# Patient Record
Sex: Male | Born: 1955
Health system: Southern US, Community
[De-identification: ages and names within clinical notes are randomized; demographics above are authoritative.]

## PROBLEM LIST (undated history)

## (undated) DIAGNOSIS — E7800 Pure hypercholesterolemia, unspecified: Secondary | ICD-10-CM

## (undated) DIAGNOSIS — M1990 Unspecified osteoarthritis, unspecified site: Secondary | ICD-10-CM

## (undated) DIAGNOSIS — E119 Type 2 diabetes mellitus without complications: Secondary | ICD-10-CM

## (undated) DIAGNOSIS — K219 Gastro-esophageal reflux disease without esophagitis: Secondary | ICD-10-CM

## (undated) HISTORY — PX: CLEFT PALATE REPAIR: SUR1165

## (undated) HISTORY — PX: CLEFT LIP REPAIR: SUR1164

## (undated) HISTORY — DX: Type 2 diabetes mellitus without complications: E11.9

---

## 2013-08-27 ENCOUNTER — Other Ambulatory Visit: Payer: Self-pay | Admitting: Physical Medicine and Rehabilitation

## 2013-08-27 DIAGNOSIS — M25511 Pain in right shoulder: Secondary | ICD-10-CM

## 2013-08-31 ENCOUNTER — Ambulatory Visit
Admission: RE | Admit: 2013-08-31 | Discharge: 2013-08-31 | Disposition: A | Discharge status: Home / Self Care | Payer: 59 | Source: Ambulatory Visit | Attending: Physical Medicine and Rehabilitation | Admitting: Physical Medicine and Rehabilitation

## 2013-08-31 DIAGNOSIS — M25511 Pain in right shoulder: Secondary | ICD-10-CM

## 2013-09-03 ENCOUNTER — Ambulatory Visit: Payer: 59 | Attending: Physical Medicine and Rehabilitation

## 2013-09-03 DIAGNOSIS — M25619 Stiffness of unspecified shoulder, not elsewhere classified: Secondary | ICD-10-CM | POA: Insufficient documentation

## 2013-09-03 DIAGNOSIS — R5381 Other malaise: Secondary | ICD-10-CM | POA: Insufficient documentation

## 2013-09-03 DIAGNOSIS — M25519 Pain in unspecified shoulder: Secondary | ICD-10-CM | POA: Insufficient documentation

## 2013-09-03 DIAGNOSIS — IMO0001 Reserved for inherently not codable concepts without codable children: Secondary | ICD-10-CM | POA: Insufficient documentation

## 2013-09-11 ENCOUNTER — Ambulatory Visit: Payer: 59 | Admitting: Physical Therapy

## 2013-09-16 ENCOUNTER — Ambulatory Visit: Payer: 59

## 2013-09-19 ENCOUNTER — Ambulatory Visit: Payer: 59 | Admitting: Physical Therapy

## 2013-09-23 ENCOUNTER — Ambulatory Visit: Payer: 59

## 2013-09-25 ENCOUNTER — Ambulatory Visit: Payer: 59

## 2013-09-30 ENCOUNTER — Ambulatory Visit: Payer: 59

## 2013-10-02 ENCOUNTER — Ambulatory Visit: Payer: 59 | Attending: Physical Medicine and Rehabilitation

## 2013-10-02 DIAGNOSIS — IMO0001 Reserved for inherently not codable concepts without codable children: Secondary | ICD-10-CM | POA: Insufficient documentation

## 2013-10-02 DIAGNOSIS — M25619 Stiffness of unspecified shoulder, not elsewhere classified: Secondary | ICD-10-CM | POA: Insufficient documentation

## 2013-10-02 DIAGNOSIS — R5381 Other malaise: Secondary | ICD-10-CM | POA: Insufficient documentation

## 2013-10-02 DIAGNOSIS — M25519 Pain in unspecified shoulder: Secondary | ICD-10-CM | POA: Insufficient documentation

## 2013-10-07 ENCOUNTER — Ambulatory Visit: Payer: 59

## 2013-10-09 ENCOUNTER — Ambulatory Visit: Payer: 59

## 2013-10-14 ENCOUNTER — Ambulatory Visit: Payer: 59

## 2014-08-08 ENCOUNTER — Emergency Department (HOSPITAL_COMMUNITY)
Admission: EM | Admit: 2014-08-08 | Discharge: 2014-08-08 | Disposition: A | Discharge status: Home / Self Care | Payer: 59 | Attending: Emergency Medicine | Admitting: Emergency Medicine

## 2014-08-08 ENCOUNTER — Emergency Department (HOSPITAL_COMMUNITY): Payer: 59

## 2014-08-08 ENCOUNTER — Encounter (HOSPITAL_COMMUNITY): Payer: Self-pay | Admitting: *Deleted

## 2014-08-08 DIAGNOSIS — Y929 Unspecified place or not applicable: Secondary | ICD-10-CM | POA: Insufficient documentation

## 2014-08-08 DIAGNOSIS — Z7982 Long term (current) use of aspirin: Secondary | ICD-10-CM | POA: Diagnosis not present

## 2014-08-08 DIAGNOSIS — W11XXXA Fall on and from ladder, initial encounter: Secondary | ICD-10-CM | POA: Insufficient documentation

## 2014-08-08 DIAGNOSIS — R42 Dizziness and giddiness: Secondary | ICD-10-CM | POA: Insufficient documentation

## 2014-08-08 DIAGNOSIS — W19XXXA Unspecified fall, initial encounter: Secondary | ICD-10-CM

## 2014-08-08 DIAGNOSIS — Y939 Activity, unspecified: Secondary | ICD-10-CM | POA: Diagnosis not present

## 2014-08-08 DIAGNOSIS — M7981 Nontraumatic hematoma of soft tissue: Secondary | ICD-10-CM | POA: Insufficient documentation

## 2014-08-08 DIAGNOSIS — Y999 Unspecified external cause status: Secondary | ICD-10-CM | POA: Insufficient documentation

## 2014-08-08 DIAGNOSIS — Z79899 Other long term (current) drug therapy: Secondary | ICD-10-CM | POA: Diagnosis not present

## 2014-08-08 DIAGNOSIS — E780 Pure hypercholesterolemia: Secondary | ICD-10-CM | POA: Diagnosis not present

## 2014-08-08 DIAGNOSIS — K219 Gastro-esophageal reflux disease without esophagitis: Secondary | ICD-10-CM | POA: Insufficient documentation

## 2014-08-08 DIAGNOSIS — S3991XA Unspecified injury of abdomen, initial encounter: Secondary | ICD-10-CM | POA: Insufficient documentation

## 2014-08-08 HISTORY — DX: Pure hypercholesterolemia, unspecified: E78.00

## 2014-08-08 HISTORY — DX: Gastro-esophageal reflux disease without esophagitis: K21.9

## 2014-08-08 LAB — I-STAT CHEM 8, ED
BUN: 19 mg/dL (ref 6–20)
Calcium, Ion: 1.11 mmol/L — ABNORMAL LOW (ref 1.13–1.30)
Chloride: 100 mmol/L — ABNORMAL LOW (ref 101–111)
Creatinine, Ser: 0.9 mg/dL (ref 0.61–1.24)
Glucose, Bld: 108 mg/dL — ABNORMAL HIGH (ref 70–99)
HCT: 42 % (ref 39.0–52.0)
Hemoglobin: 14.3 g/dL (ref 13.0–17.0)
Potassium: 4.1 mmol/L (ref 3.5–5.1)
Sodium: 137 mmol/L (ref 135–145)
TCO2: 24 mmol/L (ref 0–100)

## 2014-08-08 MED ORDER — IOHEXOL 300 MG/ML  SOLN
100.0000 mL | Freq: Once | INTRAMUSCULAR | Status: AC | PRN
  Administered 2014-08-08: 100 mL via INTRAVENOUS

## 2014-08-08 MED ORDER — FENTANYL CITRATE (PF) 100 MCG/2ML IJ SOLN
50.0000 ug | INTRAMUSCULAR | Status: DC | PRN
  Administered 2014-08-08: 50 ug via INTRAVENOUS
  Filled 2014-08-08: qty 2

## 2014-08-08 NOTE — ED Notes (Signed)
Pt took 2 extra strength tylenol at home for the pain

## 2014-08-08 NOTE — ED Provider Notes (Signed)
CSN: [id number]     Arrival date & time 08/08/14  1740 History   First MD Initiated Contact with Patient 08/08/14 1913     Chief Complaint  Patient presents with  . Fall     (Consider location/radiation/quality/duration/timing/severity/associated sxs/prior Treatment) HPI Comments: 59 year old male with high cholesterol, reflux presents with right flank pain and lightheadedness since falling from 6 feet on latter prior to arrival. No head injury no syncope. No blood thinners except for baby aspirin. Patient has tenderness and swelling to palpation right posterior flank. Pain improved with not moving  Patient is a 59 y.o. male presenting with fall. The history is provided by the patient.  Fall Pertinent negatives include no chest pain, no abdominal pain, no headaches and no shortness of breath.    Past Medical History  Diagnosis Date  . High cholesterol   . GERD (gastroesophageal reflux disease)    History reviewed. No pertinent past surgical history. History reviewed. No pertinent family history. History  Substance Use Topics  . Smoking status: Not on file  . Smokeless tobacco: Not on file  . Alcohol Use: No    Review of Systems  Constitutional: Negative for fever and chills.  HENT: Negative for congestion.   Eyes: Negative for visual disturbance.  Respiratory: Negative for shortness of breath.   Cardiovascular: Negative for chest pain.  Gastrointestinal: Negative for vomiting and abdominal pain.  Genitourinary: Negative for dysuria and flank pain.  Musculoskeletal: Negative for back pain, neck pain and neck stiffness.  Skin: Positive for wound. Negative for rash.  Neurological: Negative for weakness, light-headedness, numbness and headaches.      Allergies  Review of patient's allergies indicates no known allergies.  Home Medications   Prior to Admission medications   Medication Sig Start Date End Date Taking? Authorizing Provider  acetaminophen (TYLENOL) 500 MG  tablet Take 1,000 mg by mouth every 6 (six) hours as needed for moderate pain.   Yes Historical Provider, MD  aspirin EC 81 MG tablet Take 81 mg by mouth at bedtime.   Yes Historical Provider, MD  pantoprazole (PROTONIX) 40 MG tablet Take 40 mg by mouth at bedtime.   Yes Historical Provider, MD  rosuvastatin (CRESTOR) 20 MG tablet Take 20 mg by mouth at bedtime.   Yes Historical Provider, MD   BP 116/61 mmHg  Pulse 56  Temp(Src) 97.9 F (36.6 C) (Oral)  Resp 18  SpO2 95% Physical Exam  Constitutional: He is oriented to person, place, and time. He appears well-developed and well-nourished.  HENT:  Head: Normocephalic and atraumatic.  Eyes: Conjunctivae are normal. Right eye exhibits no discharge. Left eye exhibits no discharge.  Neck: Normal range of motion. Neck supple. No tracheal deviation present.  Cardiovascular: Normal rate.   Pulmonary/Chest: Effort normal and breath sounds normal.  Abdominal: Soft. He exhibits no distension. There is no tenderness. There is no guarding.  Musculoskeletal: He exhibits edema and tenderness.  Patient has mild tenderness and hematoma right posterior lower flank, no significant midline tenderness in lumbar spine, neck supple. Full range of motion of hips and knees without discomfort or effusion.  Neurological: He is alert and oriented to person, place, and time.  Reflex Scores:      Patellar reflexes are 1+ on the right side and 1+ on the left side.      Achilles reflexes are 1+ on the right side and 1+ on the left side. Patient has 5+ strength with flexion extension of hips knees and great toes, sensation  intact bilateral lower extremities  Skin: Skin is warm. No rash noted.  Psychiatric: He has a normal mood and affect.  Nursing note and vitals reviewed.   ED Course  Procedures (including critical care time) Labs Review Labs Reviewed  I-STAT CHEM 8, ED - Abnormal; Notable for the following:    Chloride 100 (*)    Glucose, Bld 108 (*)     Calcium, Ion 1.11 (*)    All other components within normal limits    Imaging Review Ct Abdomen Pelvis W Contrast  08/08/2014   CLINICAL DATA:  Fall 4-5 feet off of a ladder, landing on the back. Back pain and right flank pain. Initial encounter.  EXAM: CT ABDOMEN AND PELVIS WITH CONTRAST  TECHNIQUE: Multidetector CT imaging of the abdomen and pelvis was performed using the standard protocol following bolus administration of intravenous contrast.  CONTRAST:  164mL OMNIPAQUE IOHEXOL 300 MG/ML  SOLN  COMPARISON:  None.  FINDINGS: The visualized lung bases are clear.  There are multiple small hypoattenuating lesions in both lobes of the liver which measure up to 1.2 cm, most likely cysts or biliary hamartomas although full characterization is limited by their small size. The gallbladder, spleen, adrenal glands, kidneys, and pancreas are unremarkable.  There is a small sliding hiatal hernia. The bowel is nondilated without evidence of obstruction. Appendix is unremarkable. Bladder is unremarkable. Prostate is mildly enlarged. There is a small fat containing inguinal hernia on the right. Mild atherosclerotic calcification is noted of the abdominal aorta and its major branch vessels.  There is moderate fat stranding and hematoma in the subcutaneous tissues of the right flank. A focal hematoma posterior to the right iliac crest measures 6.3 x 2.2 cm. No acute fracture is identified. Thoracolumbar spondylosis and mild lumbar facet arthrosis are noted.  IMPRESSION: 1. Moderate soft tissue hematoma in the right flank. 2. No evidence of acute intra-abdominal or pelvic abnormality.   Electronically Signed   By: Logan Bores   On: 08/08/2014 21:55     EKG Interpretation None      MDM   Final diagnoses:  Hematoma, nontraumatic, soft tissue  Fall, initial encounter   Clinical concern for flank hematoma. With lightheadedness and worsening pain CT scan ordered for further delineation. CT scan results reviewed no  acute findings intraperitoneal. Follow-up discussed with patient.  Results and differential diagnosis were discussed with the patient/parent/guardian. Close follow up outpatient was discussed, comfortable with the plan.   Medications  fentaNYL (SUBLIMAZE) injection 50 mcg (50 mcg Intravenous Given 08/08/14 2308)  iohexol (OMNIPAQUE) 300 MG/ML solution 100 mL (100 mLs Intravenous Contrast Given 08/08/14 2126)    Filed Vitals:   08/08/14 1945 08/08/14 2015 08/08/14 2045 08/08/14 2245  BP: 119/61 108/66 116/61 123/62  Pulse: 54 48 56 61  Temp:      TempSrc:      Resp:    18  SpO2: 94% 97% 95% 97%    Final diagnoses:  Hematoma, nontraumatic, soft tissue  Fall, initial encounter   Correction Hematoma, traumatic    Elnora Morrison, MD 08/09/14 [phone]

## 2014-08-08 NOTE — Discharge Instructions (Signed)
If you were given medicines take as directed.  If you are on coumadin or contraceptives realize their levels and effectiveness is altered by many different medicines.  If you have any reaction (rash, tongues swelling, other) to the medicines stop taking and see a physician.   Please follow up as directed and return to the ER or see a physician for new or worsening symptoms.  Thank you. Filed Vitals:   08/08/14 1934 08/08/14 1945 08/08/14 2015 08/08/14 2045  BP: 116/61 119/61 108/66 116/61  Pulse: 94 54 48 56  Temp:      TempSrc:      Resp: 18     SpO2: 98% 94% 97% 95%

## 2014-08-08 NOTE — ED Notes (Signed)
Pt called out stating he felt faint/dizzy. Pt laying down in bed. Placed on BP cuff and pulse ox. VSS. Pt appears pale, no diaphoresis. Encouraged to stay in bed to prevent falls.

## 2014-08-08 NOTE — ED Notes (Signed)
Pt reports falling approx 6 ft off a ladder, no loc. Having right side back and flank pain.

## 2015-04-08 DIAGNOSIS — F4323 Adjustment disorder with mixed anxiety and depressed mood: Secondary | ICD-10-CM | POA: Diagnosis not present

## 2015-04-15 DIAGNOSIS — F4323 Adjustment disorder with mixed anxiety and depressed mood: Secondary | ICD-10-CM | POA: Diagnosis not present

## 2015-04-16 MED FILL — ROSUVASTATIN CALCIUM 40 MG: 40 | 30 days supply | Qty: 30 | Fill #2

## 2015-04-19 MED FILL — GAVILYTE-N SOLUTION: 420 | 1 days supply | Qty: 4000 | Fill #0

## 2015-04-22 DIAGNOSIS — F4323 Adjustment disorder with mixed anxiety and depressed mood: Secondary | ICD-10-CM | POA: Diagnosis not present

## 2015-04-28 DIAGNOSIS — K5730 Diverticulosis of large intestine without perforation or abscess without bleeding: Secondary | ICD-10-CM | POA: Diagnosis not present

## 2015-04-28 DIAGNOSIS — Z86010 Personal history of colonic polyps: Secondary | ICD-10-CM | POA: Diagnosis not present

## 2015-04-29 DIAGNOSIS — F4323 Adjustment disorder with mixed anxiety and depressed mood: Secondary | ICD-10-CM | POA: Diagnosis not present

## 2015-05-06 DIAGNOSIS — F4323 Adjustment disorder with mixed anxiety and depressed mood: Secondary | ICD-10-CM | POA: Diagnosis not present

## 2015-05-16 ENCOUNTER — Encounter (HOSPITAL_COMMUNITY): Payer: Self-pay | Admitting: Emergency Medicine

## 2015-05-16 ENCOUNTER — Emergency Department (HOSPITAL_COMMUNITY): Payer: 59

## 2015-05-16 ENCOUNTER — Emergency Department (HOSPITAL_COMMUNITY)
Admission: EM | Admit: 2015-05-16 | Discharge: 2015-05-16 | Disposition: A | Discharge status: Home / Self Care | Payer: 59 | Attending: Emergency Medicine | Admitting: Emergency Medicine

## 2015-05-16 DIAGNOSIS — S39012A Strain of muscle, fascia and tendon of lower back, initial encounter: Secondary | ICD-10-CM | POA: Diagnosis not present

## 2015-05-16 DIAGNOSIS — Z7984 Long term (current) use of oral hypoglycemic drugs: Secondary | ICD-10-CM | POA: Diagnosis not present

## 2015-05-16 DIAGNOSIS — Z7982 Long term (current) use of aspirin: Secondary | ICD-10-CM | POA: Insufficient documentation

## 2015-05-16 DIAGNOSIS — Y998 Other external cause status: Secondary | ICD-10-CM | POA: Diagnosis not present

## 2015-05-16 DIAGNOSIS — Y9389 Activity, other specified: Secondary | ICD-10-CM | POA: Insufficient documentation

## 2015-05-16 DIAGNOSIS — E7800 Pure hypercholesterolemia, unspecified: Secondary | ICD-10-CM | POA: Diagnosis not present

## 2015-05-16 DIAGNOSIS — X58XXXA Exposure to other specified factors, initial encounter: Secondary | ICD-10-CM | POA: Diagnosis not present

## 2015-05-16 DIAGNOSIS — Y9289 Other specified places as the place of occurrence of the external cause: Secondary | ICD-10-CM | POA: Diagnosis not present

## 2015-05-16 DIAGNOSIS — M1990 Unspecified osteoarthritis, unspecified site: Secondary | ICD-10-CM | POA: Diagnosis not present

## 2015-05-16 DIAGNOSIS — Z79899 Other long term (current) drug therapy: Secondary | ICD-10-CM | POA: Insufficient documentation

## 2015-05-16 DIAGNOSIS — S3992XA Unspecified injury of lower back, initial encounter: Secondary | ICD-10-CM | POA: Diagnosis present

## 2015-05-16 DIAGNOSIS — K219 Gastro-esophageal reflux disease without esophagitis: Secondary | ICD-10-CM | POA: Insufficient documentation

## 2015-05-16 DIAGNOSIS — M545 Low back pain: Secondary | ICD-10-CM | POA: Diagnosis not present

## 2015-05-16 HISTORY — DX: Unspecified osteoarthritis, unspecified site: M19.90

## 2015-05-16 MED ORDER — OXYCODONE-ACETAMINOPHEN 5-325 MG PO TABS
1.0000 | ORAL_TABLET | Freq: Once | ORAL | Status: AC
  Administered 2015-05-16: 1 via ORAL
  Filled 2015-05-16: qty 1

## 2015-05-16 MED ORDER — PREDNISONE 50 MG PO TABS: 50.0000 mg | ORAL_TABLET | Freq: Every day | ORAL | Status: DC

## 2015-05-16 MED ORDER — HYDROCODONE-ACETAMINOPHEN 5-325 MG PO TABS: 1.0000 | ORAL_TABLET | Freq: Four times a day (QID) | ORAL | Status: DC | PRN

## 2015-05-16 MED ORDER — CYCLOBENZAPRINE HCL 10 MG PO TABS: 10.0000 mg | ORAL_TABLET | Freq: Every day | ORAL | Status: DC

## 2015-05-16 NOTE — ED Notes (Addendum)
Pt arrives from home reporting L sided lower back pain radiating around to lower abdomen. Pt denies trauma, falls.   Pt denies dysuria.  Pt reports pain worse with movement and certain positions.  Pt reports some relief with celebrex.  Resp e/u, NAD noted at this time.

## 2015-05-16 NOTE — ED Provider Notes (Signed)
CSN: [id number]     Arrival date & time 05/16/15  0941 History   First MD Initiated Contact with Patient 05/16/15 [phone]     Chief Complaint  Patient presents with  . Back Pain     (Consider location/radiation/quality/duration/timing/severity/associated sxs/prior Treatment) HPI Patient presents to the Emergency Department complaining of L sided lower back pain that started yesterday while the patient was bending over to get dressed. Patient states that he has had several episodes of back pain in his lower L back over the past two to three years that occur with twisting movement, but they generally resolve within several hours. The patient came to the ED today because the pain has not resolved, and this morning he was unsure if he would be able to get out of bed. He states that the pain feels like a pulling sensation with certain radiation around to the S1, S2 dermatome of the abdomen. Patient states pain is worse with certain movement and is somewhat relieved by him hunching over to walk or move. Patient denies dizziness, lightheadedness, chest pain, SOB, palpitations, nausea, vomiting, diarrhea, dysuria, hematuria, penile discharge, urinary frequency, chills, fever, night sweats. Patient denies any weakness or numbness.  Past Medical History  Diagnosis Date  . High cholesterol   . GERD (gastroesophageal reflux disease)   . Arthritis    Past Surgical History  Procedure Laterality Date  . Cleft lip repair    . Cleft palate repair     No family history on file. Social History  Substance Use Topics  . Smoking status: Never Smoker   . Smokeless tobacco: None  . Alcohol Use: Yes     Comment: "rarely"    Review of Systems All other systems negative except as documented in the HPI. All pertinent positives and negatives as reviewed in the HPI.    Allergies  Review of patient's allergies indicates no known allergies.  Home Medications   Prior to Admission medications   Medication Sig  Start Date End Date Taking? Authorizing Provider  acetaminophen (TYLENOL) 500 MG tablet Take 1,000 mg by mouth every 6 (six) hours as needed for moderate pain.   Yes Historical Provider, MD  aspirin EC 81 MG tablet Take 81 mg by mouth at bedtime.   Yes Historical Provider, MD  metFORMIN (GLUCOPHAGE) 500 MG tablet Take 500 mg by mouth 2 (two) times daily with a meal.   Yes Historical Provider, MD  pantoprazole (PROTONIX) 40 MG tablet Take 40 mg by mouth at bedtime.   Yes Historical Provider, MD  rosuvastatin (CRESTOR) 20 MG tablet Take 20 mg by mouth at bedtime.   Yes Historical Provider, MD   BP 131/84 mmHg  Pulse 73  Temp(Src) 98.2 F (36.8 C) (Oral)  Resp 18  Ht 5\' 8"  (1.727 m)  Wt 86.183 kg  BMI 28.90 kg/m2  SpO2 97% Physical Exam  Constitutional: He is oriented to person, place, and time. He appears well-developed and well-nourished. No distress.  HENT:  Head: Normocephalic and atraumatic.  Right Ear: External ear normal.  Left Ear: External ear normal.  Eyes: Conjunctivae and EOM are normal. Pupils are equal, round, and reactive to light.  Neck: Normal range of motion. Neck supple.  Cardiovascular: Normal rate and regular rhythm.  Exam reveals no gallop and no friction rub.   No murmur heard. Pulmonary/Chest: Effort normal and breath sounds normal. No respiratory distress. He has no wheezes. He has no rales. He exhibits no tenderness.  Abdominal: Soft. Bowel sounds are normal.  He exhibits no distension and no mass. There is no tenderness. There is no rebound and no guarding.  Musculoskeletal:  Patient has decreased ROM due to pain. He has 5/5 strength in BL lower extremities.   Neurological: He is alert and oriented to person, place, and time. He has normal strength and normal reflexes.  Skin: He is not diaphoretic.    ED Course  Procedures (including critical care time) Labs Review Labs Reviewed - No data to display  Imaging Review Dg Lumbar Spine Complete  05/16/2015   CLINICAL DATA:  Back pain x2 days EXAM: LUMBAR SPINE - COMPLETE 4+ VIEW COMPARISON:  CT abdomen pelvis dated 08/08/2014 FINDINGS: Five lumbar type vertebral bodies. Normal lumbar lordosis. No evidence of fracture or dislocation. Vertebral body heights are maintained. Mild degenerative changes. Visualized bony pelvis appears intact. IMPRESSION: No fracture or dislocation is seen. Mild degenerative changes. Electronically Signed   By: Julian Hy M.D.   On: 05/16/2015 11:52   I have personally reviewed and evaluated these images and lab results as part of my medical decision-making.   Patient has normal gait and strength along with reflexes.  The patient will be advised follow up with primary care doctor a few at this time is  a lumbar strain, could be related to other issues need further evaluation.  Patient agrees the plan and all questions were answered.   Dalia Heading, PA-C 05/16/15 1158  Tanna Furry, MD 05/25/15 2251

## 2015-05-16 NOTE — Discharge Instructions (Signed)
Return here as needed.  Follow-up with her primary care doctor.  Use ice and heat on your lower back

## 2015-05-20 DIAGNOSIS — F4323 Adjustment disorder with mixed anxiety and depressed mood: Secondary | ICD-10-CM | POA: Diagnosis not present

## 2015-05-26 MED FILL — PANTOPRAZOLE SOD DR 40 MG T: 40 | 90 days supply | Qty: 90 | Fill #3

## 2015-06-03 DIAGNOSIS — F4323 Adjustment disorder with mixed anxiety and depressed mood: Secondary | ICD-10-CM | POA: Diagnosis not present

## 2015-06-10 DIAGNOSIS — F4323 Adjustment disorder with mixed anxiety and depressed mood: Secondary | ICD-10-CM | POA: Diagnosis not present

## 2015-06-24 DIAGNOSIS — F4323 Adjustment disorder with mixed anxiety and depressed mood: Secondary | ICD-10-CM | POA: Diagnosis not present

## 2015-07-06 MED FILL — ROSUVASTATIN CALCIUM 40 MG: 40 | 30 days supply | Qty: 30 | Fill #3

## 2015-07-06 MED FILL — metFORMIN HCL 500 MG TABS: 500 | 90 days supply | Qty: 180 | Fill #3

## 2015-07-15 DIAGNOSIS — F4323 Adjustment disorder with mixed anxiety and depressed mood: Secondary | ICD-10-CM | POA: Diagnosis not present

## 2015-07-29 DIAGNOSIS — F4323 Adjustment disorder with mixed anxiety and depressed mood: Secondary | ICD-10-CM | POA: Diagnosis not present

## 2015-08-05 DIAGNOSIS — F4323 Adjustment disorder with mixed anxiety and depressed mood: Secondary | ICD-10-CM | POA: Diagnosis not present

## 2015-08-12 DIAGNOSIS — F4323 Adjustment disorder with mixed anxiety and depressed mood: Secondary | ICD-10-CM | POA: Diagnosis not present

## 2015-08-19 DIAGNOSIS — F4323 Adjustment disorder with mixed anxiety and depressed mood: Secondary | ICD-10-CM | POA: Diagnosis not present

## 2015-08-26 DIAGNOSIS — F4323 Adjustment disorder with mixed anxiety and depressed mood: Secondary | ICD-10-CM | POA: Diagnosis not present

## 2015-08-31 MED FILL — ROSUVASTATIN CALCIUM 40 MG: 40 | 30 days supply | Qty: 30 | Fill #4

## 2015-08-31 MED FILL — PANTOPRAZOLE SOD DR 40 MG T: 40 | 90 days supply | Qty: 90 | Fill #0

## 2015-09-02 DIAGNOSIS — F4323 Adjustment disorder with mixed anxiety and depressed mood: Secondary | ICD-10-CM | POA: Diagnosis not present

## 2015-09-08 DIAGNOSIS — Z6827 Body mass index (BMI) 27.0-27.9, adult: Secondary | ICD-10-CM | POA: Diagnosis not present

## 2015-09-08 DIAGNOSIS — E119 Type 2 diabetes mellitus without complications: Secondary | ICD-10-CM | POA: Diagnosis not present

## 2015-09-08 DIAGNOSIS — K219 Gastro-esophageal reflux disease without esophagitis: Secondary | ICD-10-CM | POA: Diagnosis not present

## 2015-09-08 DIAGNOSIS — R7301 Impaired fasting glucose: Secondary | ICD-10-CM | POA: Diagnosis not present

## 2015-09-08 DIAGNOSIS — E784 Other hyperlipidemia: Secondary | ICD-10-CM | POA: Diagnosis not present

## 2015-09-09 DIAGNOSIS — F4323 Adjustment disorder with mixed anxiety and depressed mood: Secondary | ICD-10-CM | POA: Diagnosis not present

## 2015-09-30 DIAGNOSIS — F4323 Adjustment disorder with mixed anxiety and depressed mood: Secondary | ICD-10-CM | POA: Diagnosis not present

## 2015-09-30 MED FILL — metFORMIN HCL 1000 MG TABS: 1000 | 90 days supply | Qty: 180 | Fill #0

## 2015-10-15 DIAGNOSIS — F4323 Adjustment disorder with mixed anxiety and depressed mood: Secondary | ICD-10-CM | POA: Diagnosis not present

## 2015-10-21 DIAGNOSIS — F4323 Adjustment disorder with mixed anxiety and depressed mood: Secondary | ICD-10-CM | POA: Diagnosis not present

## 2015-10-28 DIAGNOSIS — F4323 Adjustment disorder with mixed anxiety and depressed mood: Secondary | ICD-10-CM | POA: Diagnosis not present

## 2015-11-03 MED FILL — ROSUVASTATIN CALCIUM 20 MG: 20 | 90 days supply | Qty: 45 | Fill #0

## 2015-11-04 DIAGNOSIS — F4323 Adjustment disorder with mixed anxiety and depressed mood: Secondary | ICD-10-CM | POA: Diagnosis not present

## 2015-11-18 DIAGNOSIS — F4323 Adjustment disorder with mixed anxiety and depressed mood: Secondary | ICD-10-CM | POA: Diagnosis not present

## 2015-11-25 DIAGNOSIS — F4323 Adjustment disorder with mixed anxiety and depressed mood: Secondary | ICD-10-CM | POA: Diagnosis not present

## 2015-12-02 DIAGNOSIS — F4323 Adjustment disorder with mixed anxiety and depressed mood: Secondary | ICD-10-CM | POA: Diagnosis not present

## 2015-12-23 DIAGNOSIS — F4323 Adjustment disorder with mixed anxiety and depressed mood: Secondary | ICD-10-CM | POA: Diagnosis not present

## 2015-12-27 MED FILL — metFORMIN HCL 1000 MG TABS: 1000 | 90 days supply | Qty: 180 | Fill #1

## 2016-02-11 MED FILL — ROSUVASTATIN CALCIUM 20 MG: 20 | 90 days supply | Qty: 45 | Fill #1

## 2016-02-11 MED FILL — PANTOPRAZOLE SOD DR 40 MG T: 40 | 90 days supply | Qty: 90 | Fill #1

## 2016-03-08 DIAGNOSIS — H5213 Myopia, bilateral: Secondary | ICD-10-CM | POA: Diagnosis not present

## 2016-03-08 DIAGNOSIS — E784 Other hyperlipidemia: Secondary | ICD-10-CM | POA: Diagnosis not present

## 2016-03-08 DIAGNOSIS — H524 Presbyopia: Secondary | ICD-10-CM | POA: Diagnosis not present

## 2016-03-08 DIAGNOSIS — H52223 Regular astigmatism, bilateral: Secondary | ICD-10-CM | POA: Diagnosis not present

## 2016-03-08 DIAGNOSIS — H2513 Age-related nuclear cataract, bilateral: Secondary | ICD-10-CM | POA: Diagnosis not present

## 2016-03-08 DIAGNOSIS — E119 Type 2 diabetes mellitus without complications: Secondary | ICD-10-CM | POA: Diagnosis not present

## 2016-03-08 DIAGNOSIS — Z125 Encounter for screening for malignant neoplasm of prostate: Secondary | ICD-10-CM | POA: Diagnosis not present

## 2016-03-15 DIAGNOSIS — E119 Type 2 diabetes mellitus without complications: Secondary | ICD-10-CM | POA: Diagnosis not present

## 2016-03-15 DIAGNOSIS — L988 Other specified disorders of the skin and subcutaneous tissue: Secondary | ICD-10-CM | POA: Diagnosis not present

## 2016-03-15 DIAGNOSIS — Z86010 Personal history of colonic polyps: Secondary | ICD-10-CM | POA: Diagnosis not present

## 2016-03-15 DIAGNOSIS — Z0000 Encounter for general adult medical examination without abnormal findings: Secondary | ICD-10-CM | POA: Diagnosis not present

## 2016-03-15 DIAGNOSIS — K589 Irritable bowel syndrome without diarrhea: Secondary | ICD-10-CM | POA: Diagnosis not present

## 2016-03-15 DIAGNOSIS — K219 Gastro-esophageal reflux disease without esophagitis: Secondary | ICD-10-CM | POA: Diagnosis not present

## 2016-03-15 DIAGNOSIS — E784 Other hyperlipidemia: Secondary | ICD-10-CM | POA: Diagnosis not present

## 2016-03-15 DIAGNOSIS — Z125 Encounter for screening for malignant neoplasm of prostate: Secondary | ICD-10-CM | POA: Diagnosis not present

## 2016-03-15 DIAGNOSIS — Z1389 Encounter for screening for other disorder: Secondary | ICD-10-CM | POA: Diagnosis not present

## 2016-03-15 DIAGNOSIS — G4739 Other sleep apnea: Secondary | ICD-10-CM | POA: Diagnosis not present

## 2016-03-15 DIAGNOSIS — Z23 Encounter for immunization: Secondary | ICD-10-CM | POA: Diagnosis not present

## 2016-03-15 DIAGNOSIS — Z6826 Body mass index (BMI) 26.0-26.9, adult: Secondary | ICD-10-CM | POA: Diagnosis not present

## 2016-03-21 DIAGNOSIS — Z1212 Encounter for screening for malignant neoplasm of rectum: Secondary | ICD-10-CM | POA: Diagnosis not present

## 2016-04-04 MED FILL — metFORMIN HCL 1000 MG TABS: 1000 | 90 days supply | Qty: 180 | Fill #2

## 2016-04-10 MED FILL — ROSUVASTATIN CALCIUM 20 MG: 20 | 90 days supply | Qty: 90 | Fill #0

## 2016-05-10 DIAGNOSIS — C44619 Basal cell carcinoma of skin of left upper limb, including shoulder: Secondary | ICD-10-CM | POA: Diagnosis not present

## 2016-05-10 DIAGNOSIS — D485 Neoplasm of uncertain behavior of skin: Secondary | ICD-10-CM | POA: Diagnosis not present

## 2016-05-10 DIAGNOSIS — D224 Melanocytic nevi of scalp and neck: Secondary | ICD-10-CM | POA: Diagnosis not present

## 2016-05-10 DIAGNOSIS — D234 Other benign neoplasm of skin of scalp and neck: Secondary | ICD-10-CM | POA: Diagnosis not present

## 2016-06-07 DIAGNOSIS — C44619 Basal cell carcinoma of skin of left upper limb, including shoulder: Secondary | ICD-10-CM | POA: Diagnosis not present

## 2016-07-12 MED FILL — ROSUVASTATIN CALCIUM 20 MG: 20 | 90 days supply | Qty: 90 | Fill #1

## 2016-07-12 MED FILL — metFORMIN HCL 1000 MG TABS: 1000 | 90 days supply | Qty: 180 | Fill #3

## 2016-08-24 MED FILL — PANTOPRAZOLE SOD DR 40 MG T: 40 | 90 days supply | Qty: 90 | Fill #2

## 2016-09-20 DIAGNOSIS — E784 Other hyperlipidemia: Secondary | ICD-10-CM | POA: Diagnosis not present

## 2016-09-20 DIAGNOSIS — Z85828 Personal history of other malignant neoplasm of skin: Secondary | ICD-10-CM | POA: Diagnosis not present

## 2016-09-20 DIAGNOSIS — G6289 Other specified polyneuropathies: Secondary | ICD-10-CM | POA: Diagnosis not present

## 2016-09-20 DIAGNOSIS — Z1389 Encounter for screening for other disorder: Secondary | ICD-10-CM | POA: Diagnosis not present

## 2016-09-20 DIAGNOSIS — D696 Thrombocytopenia, unspecified: Secondary | ICD-10-CM | POA: Diagnosis not present

## 2016-09-20 DIAGNOSIS — E1149 Type 2 diabetes mellitus with other diabetic neurological complication: Secondary | ICD-10-CM | POA: Diagnosis not present

## 2016-09-20 DIAGNOSIS — Z6825 Body mass index (BMI) 25.0-25.9, adult: Secondary | ICD-10-CM | POA: Diagnosis not present

## 2016-10-13 MED FILL — metFORMIN HCL 1000 MG TABS: 1000 | 90 days supply | Qty: 180 | Fill #0

## 2016-10-16 DIAGNOSIS — Z85828 Personal history of other malignant neoplasm of skin: Secondary | ICD-10-CM | POA: Diagnosis not present

## 2016-10-16 DIAGNOSIS — D1801 Hemangioma of skin and subcutaneous tissue: Secondary | ICD-10-CM | POA: Diagnosis not present

## 2016-10-16 DIAGNOSIS — L814 Other melanin hyperpigmentation: Secondary | ICD-10-CM | POA: Diagnosis not present

## 2016-10-16 DIAGNOSIS — L821 Other seborrheic keratosis: Secondary | ICD-10-CM | POA: Diagnosis not present

## 2016-10-16 DIAGNOSIS — D2361 Other benign neoplasm of skin of right upper limb, including shoulder: Secondary | ICD-10-CM | POA: Diagnosis not present

## 2016-10-16 DIAGNOSIS — D2371 Other benign neoplasm of skin of right lower limb, including hip: Secondary | ICD-10-CM | POA: Diagnosis not present

## 2016-10-23 MED FILL — ROSUVASTATIN CALCIUM 20 MG: 20 | 90 days supply | Qty: 90 | Fill #2

## 2016-12-07 MED FILL — CYCLOBENZAPRINE 10 MG TABLE: 10 | 30 days supply | Qty: 90 | Fill #0

## 2016-12-08 DIAGNOSIS — Z6826 Body mass index (BMI) 26.0-26.9, adult: Secondary | ICD-10-CM | POA: Diagnosis not present

## 2016-12-08 DIAGNOSIS — Z23 Encounter for immunization: Secondary | ICD-10-CM | POA: Diagnosis not present

## 2016-12-08 DIAGNOSIS — M545 Low back pain: Secondary | ICD-10-CM | POA: Diagnosis not present

## 2016-12-08 MED FILL — MELOXICAM 15 MG TABLET: 15 | 30 days supply | Qty: 30 | Fill #0

## 2017-01-11 MED FILL — metFORMIN HCL 1000 MG TABS: 1000 | 90 days supply | Qty: 180 | Fill #1

## 2017-01-29 MED FILL — ROSUVASTATIN CALCIUM 20 MG: 20 | 90 days supply | Qty: 90 | Fill #3

## 2017-02-19 MED FILL — PANTOPRAZOLE SOD DR 40 MG T: 40 | 90 days supply | Qty: 90 | Fill #0

## 2017-04-18 MED FILL — metFORMIN HCL 1000 MG TABS: 1000 | 90 days supply | Qty: 180 | Fill #2

## 2017-04-23 DIAGNOSIS — R82998 Other abnormal findings in urine: Secondary | ICD-10-CM | POA: Diagnosis not present

## 2017-04-23 DIAGNOSIS — E1149 Type 2 diabetes mellitus with other diabetic neurological complication: Secondary | ICD-10-CM | POA: Diagnosis not present

## 2017-04-23 DIAGNOSIS — G6289 Other specified polyneuropathies: Secondary | ICD-10-CM | POA: Diagnosis not present

## 2017-04-23 DIAGNOSIS — Z125 Encounter for screening for malignant neoplasm of prostate: Secondary | ICD-10-CM | POA: Diagnosis not present

## 2017-04-23 DIAGNOSIS — E7849 Other hyperlipidemia: Secondary | ICD-10-CM | POA: Diagnosis not present

## 2017-04-27 MED FILL — ROSUVASTATIN CALCIUM 20 MG: 20 | 90 days supply | Qty: 90 | Fill #0

## 2017-05-04 DIAGNOSIS — Z1212 Encounter for screening for malignant neoplasm of rectum: Secondary | ICD-10-CM | POA: Diagnosis not present

## 2017-05-09 DIAGNOSIS — H43811 Vitreous degeneration, right eye: Secondary | ICD-10-CM | POA: Diagnosis not present

## 2017-05-09 DIAGNOSIS — H25813 Combined forms of age-related cataract, bilateral: Secondary | ICD-10-CM | POA: Diagnosis not present

## 2017-05-09 DIAGNOSIS — H0100A Unspecified blepharitis right eye, upper and lower eyelids: Secondary | ICD-10-CM | POA: Diagnosis not present

## 2017-05-09 DIAGNOSIS — H35371 Puckering of macula, right eye: Secondary | ICD-10-CM | POA: Diagnosis not present

## 2017-05-23 DIAGNOSIS — K219 Gastro-esophageal reflux disease without esophagitis: Secondary | ICD-10-CM | POA: Diagnosis not present

## 2017-05-23 DIAGNOSIS — Z86010 Personal history of colonic polyps: Secondary | ICD-10-CM | POA: Diagnosis not present

## 2017-05-23 DIAGNOSIS — Z85828 Personal history of other malignant neoplasm of skin: Secondary | ICD-10-CM | POA: Diagnosis not present

## 2017-05-23 DIAGNOSIS — G4730 Sleep apnea, unspecified: Secondary | ICD-10-CM | POA: Diagnosis not present

## 2017-05-23 DIAGNOSIS — D696 Thrombocytopenia, unspecified: Secondary | ICD-10-CM | POA: Diagnosis not present

## 2017-05-23 DIAGNOSIS — Z1389 Encounter for screening for other disorder: Secondary | ICD-10-CM | POA: Diagnosis not present

## 2017-05-23 DIAGNOSIS — G6289 Other specified polyneuropathies: Secondary | ICD-10-CM | POA: Diagnosis not present

## 2017-05-23 DIAGNOSIS — E1149 Type 2 diabetes mellitus with other diabetic neurological complication: Secondary | ICD-10-CM | POA: Diagnosis not present

## 2017-05-23 DIAGNOSIS — E7849 Other hyperlipidemia: Secondary | ICD-10-CM | POA: Diagnosis not present

## 2017-05-23 DIAGNOSIS — Z0000 Encounter for general adult medical examination without abnormal findings: Secondary | ICD-10-CM | POA: Diagnosis not present

## 2017-05-23 MED FILL — FREESTYLE LANCETS: 90 days supply | Qty: 100 | Fill #0

## 2017-05-23 MED FILL — FREESTYLE LITE TEST STRIP: 90 days supply | Qty: 100 | Fill #0

## 2017-05-23 MED FILL — FREESTYLE LITE METER: 20 days supply | Qty: 1 | Fill #0

## 2017-05-24 MED FILL — JARDIANCE 25 MG TABLET: 25 | 30 days supply | Qty: 30 | Fill #0

## 2017-07-04 MED FILL — JARDIANCE 25 MG TABLET: 25 | 30 days supply | Qty: 30 | Fill #1

## 2017-07-17 MED FILL — ROSUVASTATIN CALCIUM 20 MG: 20 | 90 days supply | Qty: 90 | Fill #1

## 2017-07-17 MED FILL — metFORMIN HCL 1000 MG TABS: 1000 | 90 days supply | Qty: 180 | Fill #0

## 2017-07-18 MED FILL — PANTOPRAZOLE SOD DR 40 MG T: 40 | 90 days supply | Qty: 90 | Fill #1

## 2017-08-03 MED FILL — FREESTYLE LANCETS: 90 days supply | Qty: 100 | Fill #1

## 2017-08-03 MED FILL — JARDIANCE 25 MG TABLET: 25 | 30 days supply | Qty: 30 | Fill #2

## 2017-08-03 MED FILL — FREESTYLE LITE TEST STRIP: 90 days supply | Qty: 100 | Fill #1

## 2017-09-10 DIAGNOSIS — H25813 Combined forms of age-related cataract, bilateral: Secondary | ICD-10-CM | POA: Diagnosis not present

## 2017-09-10 DIAGNOSIS — H43813 Vitreous degeneration, bilateral: Secondary | ICD-10-CM | POA: Diagnosis not present

## 2017-09-10 DIAGNOSIS — H35371 Puckering of macula, right eye: Secondary | ICD-10-CM | POA: Diagnosis not present

## 2017-09-10 MED FILL — JARDIANCE 25 MG TABLET: 25 | 30 days supply | Qty: 30 | Fill #3

## 2017-09-19 DIAGNOSIS — Z6823 Body mass index (BMI) 23.0-23.9, adult: Secondary | ICD-10-CM | POA: Diagnosis not present

## 2017-09-19 DIAGNOSIS — G6289 Other specified polyneuropathies: Secondary | ICD-10-CM | POA: Diagnosis not present

## 2017-09-19 DIAGNOSIS — E7849 Other hyperlipidemia: Secondary | ICD-10-CM | POA: Diagnosis not present

## 2017-09-19 DIAGNOSIS — K219 Gastro-esophageal reflux disease without esophagitis: Secondary | ICD-10-CM | POA: Diagnosis not present

## 2017-09-19 DIAGNOSIS — E1149 Type 2 diabetes mellitus with other diabetic neurological complication: Secondary | ICD-10-CM | POA: Diagnosis not present

## 2017-10-09 DIAGNOSIS — H268 Other specified cataract: Secondary | ICD-10-CM | POA: Diagnosis not present

## 2017-10-09 DIAGNOSIS — H25811 Combined forms of age-related cataract, right eye: Secondary | ICD-10-CM | POA: Diagnosis not present

## 2017-10-09 DIAGNOSIS — H21561 Pupillary abnormality, right eye: Secondary | ICD-10-CM | POA: Diagnosis not present

## 2017-10-09 MED FILL — PREDNISOLONE AC 1% EYE DROP: 1 | 50 days supply | Qty: 10 | Fill #0

## 2017-10-09 MED FILL — PROLENSA 0.07% EYE DROPS: 0.07 | 5 days supply | Qty: 3 | Fill #0

## 2017-10-09 MED FILL — GATIFLOXACIN 0.5% EYE DROPS: 0.5 | 13 days supply | Qty: 3 | Fill #0

## 2017-10-11 MED FILL — ROSUVASTATIN CALCIUM 20 MG: 20 | 90 days supply | Qty: 90 | Fill #2

## 2017-10-11 MED FILL — JARDIANCE 25 MG TABLET: 25 | 30 days supply | Qty: 30 | Fill #4

## 2017-10-18 MED FILL — MOXIFLOXACIN HCL 0.5 % SOLN: 0.5 | 15 days supply | Qty: 3 | Fill #0

## 2017-10-23 DIAGNOSIS — H21562 Pupillary abnormality, left eye: Secondary | ICD-10-CM | POA: Diagnosis not present

## 2017-10-23 DIAGNOSIS — H25812 Combined forms of age-related cataract, left eye: Secondary | ICD-10-CM | POA: Diagnosis not present

## 2017-10-23 DIAGNOSIS — H268 Other specified cataract: Secondary | ICD-10-CM | POA: Diagnosis not present

## 2017-10-29 MED FILL — JARDIANCE 25 MG TABLET: 25 | 30 days supply | Qty: 30 | Fill #5

## 2017-10-29 MED FILL — metFORMIN HCL 1000 MG TABS: 1000 | 90 days supply | Qty: 180 | Fill #1

## 2017-10-29 MED FILL — PREDNISOLONE AC 1% EYE DROP: 1 | 50 days supply | Qty: 10 | Fill #0

## 2017-10-29 MED FILL — PROLENSA 0.07% EYE DROPS: 0.07 | 60 days supply | Qty: 3 | Fill #0

## 2017-12-12 MED FILL — JARDIANCE 25 MG TABLET: 25 | 30 days supply | Qty: 30 | Fill #6

## 2018-01-08 MED FILL — JARDIANCE 25 MG TABLET: 25 | 30 days supply | Qty: 30 | Fill #7

## 2018-01-30 DIAGNOSIS — G6289 Other specified polyneuropathies: Secondary | ICD-10-CM | POA: Diagnosis not present

## 2018-01-30 DIAGNOSIS — Z6822 Body mass index (BMI) 22.0-22.9, adult: Secondary | ICD-10-CM | POA: Diagnosis not present

## 2018-01-30 DIAGNOSIS — E1149 Type 2 diabetes mellitus with other diabetic neurological complication: Secondary | ICD-10-CM | POA: Diagnosis not present

## 2018-01-30 DIAGNOSIS — E7849 Other hyperlipidemia: Secondary | ICD-10-CM | POA: Diagnosis not present

## 2018-01-30 DIAGNOSIS — K219 Gastro-esophageal reflux disease without esophagitis: Secondary | ICD-10-CM | POA: Diagnosis not present

## 2018-02-06 MED FILL — ROSUVASTATIN CALCIUM 20 MG: 20 | 90 days supply | Qty: 90 | Fill #3

## 2018-02-06 MED FILL — JARDIANCE 25 MG TABLET: 25 | 30 days supply | Qty: 30 | Fill #8

## 2018-02-15 ENCOUNTER — Ambulatory Visit (INDEPENDENT_AMBULATORY_CARE_PROVIDER_SITE_OTHER): Payer: Self-pay | Admitting: Family Medicine

## 2018-02-15 VITALS — BP 121/82 | HR 72 | Temp 98.5°F | Wt 152.0 lb

## 2018-02-15 DIAGNOSIS — J029 Acute pharyngitis, unspecified: Secondary | ICD-10-CM

## 2018-02-15 LAB — POCT RAPID STREP A (OFFICE): Rapid Strep A Screen: NEGATIVE

## 2018-02-15 NOTE — Progress Notes (Signed)
poc

## 2018-02-15 NOTE — Progress Notes (Signed)
Joshua Lamb is a 62 y.o. male who presents today with concerns of sore throat for the past day. He reports that he is generally healthy under the care of PCP and reports he is compliant with a few daily medications.  Review of Systems  Constitutional: Negative for chills, fever and malaise/fatigue.  HENT: Positive for sore throat. Negative for congestion, ear discharge, ear pain and sinus pain.   Eyes: Negative.   Respiratory: Negative for cough, sputum production and shortness of breath.   Cardiovascular: Negative.  Negative for chest pain.  Gastrointestinal: Negative for abdominal pain, diarrhea, nausea and vomiting.  Genitourinary: Negative for dysuria, frequency, hematuria and urgency.  Musculoskeletal: Negative for myalgias.  Skin: Negative.   Neurological: Negative for headaches.  Endo/Heme/Allergies: Negative.   Psychiatric/Behavioral: Negative.     O: Vitals:   02/15/18 1019  BP: 121/82  Pulse: 72  Temp: 98.5 F (36.9 C)  SpO2: 98%     Physical Exam  Constitutional: He is oriented to person, place, and time. Vital signs are normal. He appears well-developed and well-nourished. He is active.  Non-toxic appearance. He does not have a sickly appearance.  HENT:  Head: Normocephalic.  Right Ear: Hearing, tympanic membrane, external ear and ear canal normal.  Left Ear: Hearing, tympanic membrane, external ear and ear canal normal.  Nose: Nose normal.  Mouth/Throat: Uvula is midline and oropharynx is clear and moist.  Neck: Normal range of motion. Neck supple.  Cardiovascular: Normal rate, regular rhythm, normal heart sounds and normal pulses.  Pulmonary/Chest: Effort normal and breath sounds normal.  Abdominal: Soft. Bowel sounds are normal.  Musculoskeletal: Normal range of motion.  Lymphadenopathy:       Head (right side): No submental and no submandibular adenopathy present.       Head (left side): No submental and no submandibular adenopathy present.    He has  no cervical adenopathy.  Neurological: He is alert and oriented to person, place, and time.  Psychiatric: He has a normal mood and affect.  Vitals reviewed.  A: 1. Sore throat    P: Discussed exam findings, diagnosis etiology and medication use and indications reviewed with patient. Follow- Up and discharge instructions provided. No emergent/urgent issues found on exam.  Patient verbalized understanding of information provided and agrees with plan of care (POC), all questions answered.  1. Sore throat - POCT rapid strep A Results for orders placed or performed in visit on 02/15/18 (from the past 24 hour(s))  POCT rapid strep A     Status: Normal   Collection Time: 02/15/18 10:51 AM  Result Value Ref Range   Rapid Strep A Screen Negative Negative

## 2018-02-15 NOTE — Patient Instructions (Signed)

## 2018-02-18 ENCOUNTER — Telehealth: Payer: Self-pay

## 2018-02-18 NOTE — Telephone Encounter (Signed)
Patient was having dinner.

## 2018-03-11 MED FILL — JARDIANCE 25 MG TABLET: 25 | 30 days supply | Qty: 30 | Fill #9

## 2018-03-12 MED FILL — metFORMIN HCL 1000 MG TABS: 1000 | 90 days supply | Qty: 180 | Fill #2

## 2018-04-11 MED FILL — JARDIANCE 25 MG TABLET: 25 | 30 days supply | Qty: 30 | Fill #10

## 2018-05-15 MED FILL — JARDIANCE 25 MG TABLET: 25 | 30 days supply | Qty: 30 | Fill #11

## 2018-05-16 MED FILL — ROSUVASTATIN CALCIUM 20 MG: 20 | 90 days supply | Qty: 90 | Fill #0

## 2018-05-31 DIAGNOSIS — E1149 Type 2 diabetes mellitus with other diabetic neurological complication: Secondary | ICD-10-CM | POA: Diagnosis not present

## 2018-05-31 DIAGNOSIS — E7849 Other hyperlipidemia: Secondary | ICD-10-CM | POA: Diagnosis not present

## 2018-05-31 DIAGNOSIS — R82998 Other abnormal findings in urine: Secondary | ICD-10-CM | POA: Diagnosis not present

## 2018-05-31 DIAGNOSIS — Z0000 Encounter for general adult medical examination without abnormal findings: Secondary | ICD-10-CM | POA: Diagnosis not present

## 2018-05-31 DIAGNOSIS — Z125 Encounter for screening for malignant neoplasm of prostate: Secondary | ICD-10-CM | POA: Diagnosis not present

## 2018-06-05 DIAGNOSIS — G629 Polyneuropathy, unspecified: Secondary | ICD-10-CM | POA: Diagnosis not present

## 2018-06-05 DIAGNOSIS — Z1339 Encounter for screening examination for other mental health and behavioral disorders: Secondary | ICD-10-CM | POA: Diagnosis not present

## 2018-06-05 DIAGNOSIS — E7849 Other hyperlipidemia: Secondary | ICD-10-CM | POA: Diagnosis not present

## 2018-06-05 DIAGNOSIS — Z0000 Encounter for general adult medical examination without abnormal findings: Secondary | ICD-10-CM | POA: Diagnosis not present

## 2018-06-05 DIAGNOSIS — K219 Gastro-esophageal reflux disease without esophagitis: Secondary | ICD-10-CM | POA: Diagnosis not present

## 2018-06-05 DIAGNOSIS — D696 Thrombocytopenia, unspecified: Secondary | ICD-10-CM | POA: Diagnosis not present

## 2018-06-05 DIAGNOSIS — E1149 Type 2 diabetes mellitus with other diabetic neurological complication: Secondary | ICD-10-CM | POA: Diagnosis not present

## 2018-06-05 DIAGNOSIS — Z85828 Personal history of other malignant neoplasm of skin: Secondary | ICD-10-CM | POA: Diagnosis not present

## 2018-06-05 DIAGNOSIS — G4730 Sleep apnea, unspecified: Secondary | ICD-10-CM | POA: Diagnosis not present

## 2018-06-05 DIAGNOSIS — M545 Low back pain: Secondary | ICD-10-CM | POA: Diagnosis not present

## 2018-06-05 DIAGNOSIS — Z1331 Encounter for screening for depression: Secondary | ICD-10-CM | POA: Diagnosis not present

## 2018-06-05 DIAGNOSIS — Z125 Encounter for screening for malignant neoplasm of prostate: Secondary | ICD-10-CM | POA: Diagnosis not present

## 2018-06-11 DIAGNOSIS — Z1212 Encounter for screening for malignant neoplasm of rectum: Secondary | ICD-10-CM | POA: Diagnosis not present

## 2018-06-12 MED FILL — metFORMIN HCL 1000 MG TABS: 1000 | 90 days supply | Qty: 180 | Fill #3

## 2018-06-12 MED FILL — JARDIANCE 25 MG TABLET: 25 | 30 days supply | Qty: 30 | Fill #0

## 2018-07-05 MED FILL — JARDIANCE 25 MG TABLET: 25 | 30 days supply | Qty: 30 | Fill #1

## 2018-08-02 MED FILL — ROSUVASTATIN CALCIUM 20 MG: 20 | 90 days supply | Qty: 90 | Fill #1

## 2018-08-19 MED FILL — JARDIANCE 25 MG TABLET: 25 | 30 days supply | Qty: 30 | Fill #2

## 2018-09-21 MED FILL — JARDIANCE 25 MG TABLET: 25 | 30 days supply | Qty: 30 | Fill #3

## 2018-10-24 MED FILL — JARDIANCE 25 MG TABLET: 25 | 30 days supply | Qty: 30 | Fill #4

## 2018-11-07 MED FILL — ROSUVASTATIN CALCIUM 20 MG: 20 | 90 days supply | Qty: 90 | Fill #2

## 2018-11-21 MED FILL — JARDIANCE 25 MG TABLET: 25 | 30 days supply | Qty: 30 | Fill #5

## 2018-11-21 MED FILL — metFORMIN HCL 1000 MG TABS: 1000 | 90 days supply | Qty: 180 | Fill #0

## 2018-12-02 DIAGNOSIS — H35371 Puckering of macula, right eye: Secondary | ICD-10-CM | POA: Diagnosis not present

## 2018-12-02 DIAGNOSIS — E119 Type 2 diabetes mellitus without complications: Secondary | ICD-10-CM | POA: Diagnosis not present

## 2018-12-02 DIAGNOSIS — H52203 Unspecified astigmatism, bilateral: Secondary | ICD-10-CM | POA: Diagnosis not present

## 2018-12-02 DIAGNOSIS — H524 Presbyopia: Secondary | ICD-10-CM | POA: Diagnosis not present

## 2018-12-02 DIAGNOSIS — H43813 Vitreous degeneration, bilateral: Secondary | ICD-10-CM | POA: Diagnosis not present

## 2018-12-04 DIAGNOSIS — E785 Hyperlipidemia, unspecified: Secondary | ICD-10-CM | POA: Diagnosis not present

## 2018-12-04 DIAGNOSIS — E1149 Type 2 diabetes mellitus with other diabetic neurological complication: Secondary | ICD-10-CM | POA: Diagnosis not present

## 2018-12-04 DIAGNOSIS — D696 Thrombocytopenia, unspecified: Secondary | ICD-10-CM | POA: Diagnosis not present

## 2018-12-04 DIAGNOSIS — G629 Polyneuropathy, unspecified: Secondary | ICD-10-CM | POA: Diagnosis not present

## 2018-12-05 DIAGNOSIS — Z23 Encounter for immunization: Secondary | ICD-10-CM | POA: Diagnosis not present

## 2018-12-05 DIAGNOSIS — E1149 Type 2 diabetes mellitus with other diabetic neurological complication: Secondary | ICD-10-CM | POA: Diagnosis not present

## 2019-01-13 MED FILL — JARDIANCE 25 MG TABLET: 25 | 30 days supply | Qty: 30 | Fill #6

## 2019-02-16 MED FILL — ROSUVASTATIN CALCIUM 20 MG: 20 | 90 days supply | Qty: 90 | Fill #3

## 2019-02-17 MED FILL — JARDIANCE 25 MG TABLET: 25 | 30 days supply | Qty: 30 | Fill #7

## 2019-03-05 DIAGNOSIS — E7849 Other hyperlipidemia: Secondary | ICD-10-CM | POA: Diagnosis not present

## 2019-03-05 DIAGNOSIS — E1149 Type 2 diabetes mellitus with other diabetic neurological complication: Secondary | ICD-10-CM | POA: Diagnosis not present

## 2019-03-31 MED FILL — JARDIANCE 25 MG TABLET: 25 | 30 days supply | Qty: 30 | Fill #8

## 2019-03-31 MED FILL — metFORMIN HCL 1000 MG TABS: 1000 | 90 days supply | Qty: 180 | Fill #1

## 2019-04-29 MED FILL — JARDIANCE 25 MG TABLET: 25 | 30 days supply | Qty: 30 | Fill #9

## 2019-05-01 ENCOUNTER — Encounter: Payer: 59 | Admitting: Dietician

## 2019-05-23 MED FILL — ROSUVASTATIN CALCIUM 20 MG: 20 | 90 days supply | Qty: 90 | Fill #0

## 2019-05-31 MED FILL — JARDIANCE 25 MG TABLET: 25 | 30 days supply | Qty: 30 | Fill #10

## 2019-06-02 ENCOUNTER — Encounter: Payer: 59 | Attending: Internal Medicine | Admitting: Dietician

## 2019-06-02 ENCOUNTER — Other Ambulatory Visit: Payer: Self-pay

## 2019-06-02 DIAGNOSIS — E119 Type 2 diabetes mellitus without complications: Secondary | ICD-10-CM | POA: Diagnosis not present

## 2019-06-02 NOTE — Progress Notes (Signed)
Diabetes Self-Management Education  Visit Type: First/Initial  Appt. Start Time: 1525 Appt. End Time: 1630  06/03/2019  Mr. Joshua Lamb, identified by name and date of birth, is a 64 y.o. male with a diagnosis of Diabetes: Type 2.   ASSESSMENT Patient is here alone.  He is looking for getting more ideas related to food.  History includes Type 2 diabetes for the past 3 1/2-4 years. Weight 191 lbs when diagnosed.  Wife encourages and cooks a more Mediterranean diet. Patient lost to 137 lbs.  Feels that his diet is too restrictive at times.  Has regained to 145 lbs and feels better at this weight.  Patient lives with his wife.  His wife does the shopping and cooking.  She is trying to move him towards more vegetables.  Trends more Mediterranean. 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 He has a total gym in his home, enjoys biking and walking when the weather is good, plays pickleball.  Has not been as physically active recently.    Dislikes bananas, used to like sweetened breakfast cereal, used to love OJ and wishes he could drink more than a swig.   Bored with breakfast and would like more ideas. His diet is high in saturated fat.  He enjoys increased amounts of butter.  Height 5\' 8"  (1.727 m), weight 145 lb (65.8 kg). Body mass index is 22.05 kg/m.  Diabetes Self-Management Education - 06/03/19 2139      Visit Information   Visit Type  First/Initial      Initial Visit   Diabetes Type  Type 2    Are you currently following a meal plan?  Yes    What type of meal plan do you follow?  sometimes Mediterranean    Are you taking your medications as prescribed?  Not on Medications    Date Diagnosed  2017      Health Coping   How would you rate your overall health?  Good      Psychosocial Assessment   Patient Belief/Attitude about Diabetes  Other (comment)   mostly motivated   Self-care barriers  None    Self-management support  Doctor's  office;Family    Other persons present  Patient    Patient Concerns  Nutrition/Meal planning    Special Needs  None    Preferred Learning Style  No preference indicated    Learning Readiness  Ready    How often do you need to have someone help you when you read instructions, pamphlets, or other written materials from your doctor or pharmacy?  1 - Never    What is the last grade level you completed in school?  Post Graduate, MD      Pre-Education Assessment   Patient understands the diabetes disease and treatment process.  Needs Review    Patient understands incorporating nutritional management into lifestyle.  Needs Review    Patient undertands incorporating physical activity into lifestyle.  Needs Review    Patient understands using medications safely.  Demonstrates understanding / competency    Patient understands monitoring blood glucose, interpreting and using results  Demonstrates understanding / competency    Patient understands prevention, detection, and treatment of acute complications.  Demonstrates understanding / competency    Patient understands prevention, detection, and treatment of chronic complications.  Demonstrates understanding / competency    Patient understands how to develop strategies to address psychosocial issues.  Demonstrates understanding / competency    Patient understands  how to develop strategies to promote health/change behavior.  Needs Review      Complications   Last HgB A1C per patient/outside source  6.3 %   decreased from 7.9% 12/22/2018   How often do you check your blood sugar?  0 times/day (not testing)    Have you had a dilated eye exam in the past 12 months?  Yes    Have you had a dental exam in the past 12 months?  Yes      Dietary Intake   Breakfast  plain high protein Specail K, 2% milk, occasional fried egg "bored with breakfast"    Snack (morning)  none    Lunch  homemade soup, 1-2 salami, american cheese, greens sandwiches on low carb [employer],  1/3 medium bag of potato chips OR pizza or jail food    Snack (afternoon)  peanuts    Dinner  pork chops or other meat, butternut squash, salad, legumes at times OR pasta OR soup and sandwiches    Snack (evening)  peanuts, pink lady apples OR 2 chocolate chips cookies or ice cream or other dessert occasionally or chocolate    Beverage(s)  milk, water OJ, very rare wine      Patient Education   Previous Diabetes Education  No    Disease state   Other (comment)   reviewed insulin resistance   Nutrition management   Role of diet in the treatment of diabetes and the relationship between the three main macronutrients and blood glucose level;Food label reading, portion sizes and measuring food.;Carbohydrate counting;Meal options for control of blood glucose level and chronic complications.;Reviewed blood glucose goals for pre and post meals and how to evaluate the patients' food intake on their blood glucose level.    Physical activity and exercise   Role of exercise on diabetes management, blood pressure control and cardiac health.    Personal strategies to promote health  Helped patient develop diabetes management plan for (enter comment)      Individualized Goals (developed by patient)   Nutrition  General guidelines for healthy choices and portions discussed    Physical Activity  Exercise 5-7 days per week;30 minutes per day    Medications  Not Applicable    Health Coping  discuss diabetes with (comment)   MD, RD, CDE     Post-Education Assessment   Patient understands the diabetes disease and treatment process.  Demonstrates understanding / competency    Patient understands incorporating nutritional management into lifestyle.  Demonstrates understanding / competency    Patient undertands incorporating physical activity into lifestyle.  Demonstrates understanding / competency    Patient understands using medications safely.  Demonstrates understanding / competency    Patient understands  monitoring blood glucose, interpreting and using results  Demonstrates understanding / competency    Patient understands prevention, detection, and treatment of acute complications.  Demonstrates understanding / competency    Patient understands prevention, detection, and treatment of chronic complications.  Demonstrates understanding / competency    Patient understands how to develop strategies to address psychosocial issues.  Demonstrates understanding / competency    Patient understands how to develop strategies to promote health/change behavior.  Demonstrates understanding / competency      Outcomes   Expected Outcomes  Demonstrated interest in learning. Expect positive outcomes    Future DMSE  PRN    Program Status  Completed       Individualized Plan for Diabetes Self-Management Training:   Learning Objective:  Patient will have  a greater understanding of diabetes self-management. Patient education plan is to attend individual and/or group sessions per assessed needs and concerns.   Plan:   Patient Instructions  Consider substituting the butter (or part of the butter) with more unsaturated fats such as olive oil or avocado.  Stay active and aim for at least 30 minutes most days. Continued Mindfulness related to meal choices. Pay attention to your body.  When are you satisfied with a meal? Choose small amounts of protein with each meal. Include many non starchy vegetables, whole grains and fruits which are high in fiber.  Aim for about 5 servings of carbohydrates (75 grams) per meal Aim for 0-15 grams of carbohydrates if you are hungry between meals.  Consider checking your blood glucose periodically 2 hours after your largest meal.    Expected Outcomes:  Demonstrated interest in learning. Expect positive outcomes  Education material provided: ADA - How to Thrive: A Guide for Your Journey with Diabetes, Food label handouts, Meal plan card and Snack sheet, Mediterranean  diet, Breakfast ideas  If problems or questions, patient to contact team via:  Phone and Email  Future DSME appointment: PRN

## 2019-06-02 NOTE — Patient Instructions (Signed)
Consider substituting the butter (or part of the butter) with more unsaturated fats such as olive oil or avocado.  Stay active and aim for at least 30 minutes most days. Continued Mindfulness related to meal choices. Pay attention to your body.  When are you satisfied with a meal? Choose small amounts of protein with each meal. Include many non starchy vegetables, whole grains and fruits which are high in fiber.  Aim for about 5 servings of carbohydrates (75 grams) per meal Aim for 0-15 grams of carbohydrates if you are hungry between meals.  Consider checking your blood glucose periodically 2 hours after your largest meal.

## 2019-06-03 ENCOUNTER — Encounter: Payer: Self-pay | Admitting: Dietician

## 2019-07-01 MED FILL — METFORMIN HCL 1000 MG TABS: 1000 | 90 days supply | Qty: 180 | Fill #2

## 2019-07-01 MED FILL — JARDIANCE 25 MG TABLET: 25 | 30 days supply | Qty: 30 | Fill #0

## 2019-07-31 MED FILL — JARDIANCE 25 MG TABLET: 25 | 30 days supply | Qty: 30 | Fill #1

## 2019-08-07 DIAGNOSIS — E1149 Type 2 diabetes mellitus with other diabetic neurological complication: Secondary | ICD-10-CM | POA: Diagnosis not present

## 2019-08-07 DIAGNOSIS — E538 Deficiency of other specified B group vitamins: Secondary | ICD-10-CM | POA: Diagnosis not present

## 2019-08-07 DIAGNOSIS — Z125 Encounter for screening for malignant neoplasm of prostate: Secondary | ICD-10-CM | POA: Diagnosis not present

## 2019-08-07 DIAGNOSIS — Z0000 Encounter for general adult medical examination without abnormal findings: Secondary | ICD-10-CM | POA: Diagnosis not present

## 2019-08-07 DIAGNOSIS — E7849 Other hyperlipidemia: Secondary | ICD-10-CM | POA: Diagnosis not present

## 2019-08-07 DIAGNOSIS — D696 Thrombocytopenia, unspecified: Secondary | ICD-10-CM | POA: Diagnosis not present

## 2019-08-07 LAB — LIPID PANEL
LDL Cholesterol: 70
Triglycerides: 62 (ref 40–160)

## 2019-08-07 LAB — BASIC METABOLIC PANEL: Creatinine: 0.8 (ref 0.6–1.3)

## 2019-08-07 LAB — HEMOGLOBIN A1C: Hemoglobin A1C: 7.2

## 2019-08-14 DIAGNOSIS — Z1212 Encounter for screening for malignant neoplasm of rectum: Secondary | ICD-10-CM | POA: Diagnosis not present

## 2019-08-14 DIAGNOSIS — E7849 Other hyperlipidemia: Secondary | ICD-10-CM | POA: Diagnosis not present

## 2019-08-14 LAB — MICROALBUMIN, URINE: Microalb, Ur: 24

## 2019-08-21 DIAGNOSIS — G4730 Sleep apnea, unspecified: Secondary | ICD-10-CM | POA: Diagnosis not present

## 2019-08-21 DIAGNOSIS — D696 Thrombocytopenia, unspecified: Secondary | ICD-10-CM | POA: Diagnosis not present

## 2019-08-21 DIAGNOSIS — K219 Gastro-esophageal reflux disease without esophagitis: Secondary | ICD-10-CM | POA: Diagnosis not present

## 2019-08-21 DIAGNOSIS — E1149 Type 2 diabetes mellitus with other diabetic neurological complication: Secondary | ICD-10-CM | POA: Diagnosis not present

## 2019-08-21 DIAGNOSIS — Z1331 Encounter for screening for depression: Secondary | ICD-10-CM | POA: Diagnosis not present

## 2019-08-21 DIAGNOSIS — Z0000 Encounter for general adult medical examination without abnormal findings: Secondary | ICD-10-CM | POA: Diagnosis not present

## 2019-08-21 DIAGNOSIS — Z86010 Personal history of colonic polyps: Secondary | ICD-10-CM | POA: Diagnosis not present

## 2019-08-21 DIAGNOSIS — E785 Hyperlipidemia, unspecified: Secondary | ICD-10-CM | POA: Diagnosis not present

## 2019-08-21 DIAGNOSIS — G629 Polyneuropathy, unspecified: Secondary | ICD-10-CM | POA: Diagnosis not present

## 2019-08-25 DIAGNOSIS — D72819 Decreased white blood cell count, unspecified: Secondary | ICD-10-CM | POA: Diagnosis not present

## 2019-08-25 DIAGNOSIS — R509 Fever, unspecified: Secondary | ICD-10-CM | POA: Diagnosis not present

## 2019-08-25 DIAGNOSIS — D696 Thrombocytopenia, unspecified: Secondary | ICD-10-CM | POA: Diagnosis not present

## 2019-08-25 DIAGNOSIS — B9789 Other viral agents as the cause of diseases classified elsewhere: Secondary | ICD-10-CM | POA: Diagnosis not present

## 2019-08-25 DIAGNOSIS — Z1152 Encounter for screening for COVID-19: Secondary | ICD-10-CM | POA: Diagnosis not present

## 2019-08-25 DIAGNOSIS — N390 Urinary tract infection, site not specified: Secondary | ICD-10-CM | POA: Diagnosis not present

## 2019-08-28 DIAGNOSIS — D72819 Decreased white blood cell count, unspecified: Secondary | ICD-10-CM | POA: Diagnosis not present

## 2019-09-02 DIAGNOSIS — D72819 Decreased white blood cell count, unspecified: Secondary | ICD-10-CM | POA: Diagnosis not present

## 2019-09-02 MED FILL — ROSUVASTATIN CALCIUM 20 MG: 20 | 90 days supply | Qty: 90 | Fill #1

## 2019-09-02 MED FILL — JARDIANCE 25 MG TABLET: 25 | 30 days supply | Qty: 30 | Fill #2

## 2019-09-20 ENCOUNTER — Other Ambulatory Visit: Payer: Self-pay

## 2019-09-20 ENCOUNTER — Encounter (HOSPITAL_COMMUNITY): Payer: Self-pay | Admitting: Emergency Medicine

## 2019-09-20 ENCOUNTER — Emergency Department (HOSPITAL_COMMUNITY)
Admission: EM | Admit: 2019-09-20 | Discharge: 2019-09-20 | Disposition: A | Discharge status: Home / Self Care | Payer: 59 | Attending: Emergency Medicine | Admitting: Emergency Medicine

## 2019-09-20 ENCOUNTER — Emergency Department (HOSPITAL_COMMUNITY): Payer: 59

## 2019-09-20 DIAGNOSIS — W228XXA Striking against or struck by other objects, initial encounter: Secondary | ICD-10-CM | POA: Diagnosis not present

## 2019-09-20 DIAGNOSIS — Y92007 Garden or yard of unspecified non-institutional (private) residence as the place of occurrence of the external cause: Secondary | ICD-10-CM | POA: Diagnosis not present

## 2019-09-20 DIAGNOSIS — Y999 Unspecified external cause status: Secondary | ICD-10-CM | POA: Diagnosis not present

## 2019-09-20 DIAGNOSIS — Z7982 Long term (current) use of aspirin: Secondary | ICD-10-CM | POA: Diagnosis not present

## 2019-09-20 DIAGNOSIS — E119 Type 2 diabetes mellitus without complications: Secondary | ICD-10-CM | POA: Insufficient documentation

## 2019-09-20 DIAGNOSIS — Z7984 Long term (current) use of oral hypoglycemic drugs: Secondary | ICD-10-CM | POA: Insufficient documentation

## 2019-09-20 DIAGNOSIS — S199XXA Unspecified injury of neck, initial encounter: Secondary | ICD-10-CM | POA: Diagnosis not present

## 2019-09-20 DIAGNOSIS — W19XXXA Unspecified fall, initial encounter: Secondary | ICD-10-CM

## 2019-09-20 DIAGNOSIS — S0993XA Unspecified injury of face, initial encounter: Secondary | ICD-10-CM | POA: Diagnosis present

## 2019-09-20 DIAGNOSIS — S022XXA Fracture of nasal bones, initial encounter for closed fracture: Secondary | ICD-10-CM | POA: Insufficient documentation

## 2019-09-20 DIAGNOSIS — Y93H2 Activity, gardening and landscaping: Secondary | ICD-10-CM | POA: Insufficient documentation

## 2019-09-20 DIAGNOSIS — S01112A Laceration without foreign body of left eyelid and periocular area, initial encounter: Secondary | ICD-10-CM | POA: Diagnosis not present

## 2019-09-20 DIAGNOSIS — S0181XA Laceration without foreign body of other part of head, initial encounter: Secondary | ICD-10-CM

## 2019-09-20 DIAGNOSIS — S0990XA Unspecified injury of head, initial encounter: Secondary | ICD-10-CM | POA: Diagnosis not present

## 2019-09-20 NOTE — ED Provider Notes (Signed)
[facility] EMERGENCY DEPARTMENT Provider Note   CSN: [id number] Arrival date & time: 09/20/19  2111    History Chief Complaint  Patient presents with  . Fall    Joshua Lamb is a 64 y.o. male with past medical history who presents for evaluation of fall.  Patient states he was working on the yard when he turned around and stepped into a [employer] account [employer].  Patient states he subsequently fell hitting his head on the concrete.  Unknown last tetanus.  Denies LOC or anticoagulation.  He has been ambulatory since the incident.  Denies any vision changes, headache, lightheadedness, dizziness, neck pain, neck stiffness, chest pain, shortness of breath abdominal pain, diarrhea, dysuria, decreased range of motion or pain to his extremities.  He admits to some swelling to his left eyebrow with overlying laceration.  Denies additional aggravating or relieving factors.  History obtained from patient, wife in room and past medical records.  No interpreter used.  HPI     Past Medical History:  Diagnosis Date  . Arthritis   . Diabetes mellitus without complication ([place])   . GERD (gastroesophageal reflux disease)   . High cholesterol     There are no problems to display for this patient.   Past Surgical History:  Procedure Laterality Date  . CLEFT LIP REPAIR    . CLEFT PALATE REPAIR         No family history on file.  Social History   Tobacco Use  . Smoking status: Never Smoker  . Smokeless tobacco: Never Used  Substance Use Topics  . Alcohol use: Yes    Comment: "rarely"  . Drug use: No    Home Medications Prior to Admission medications   Medication Sig Start Date End Date Taking? Authorizing Provider  acetaminophen (TYLENOL) 500 MG tablet Take 1,000 mg by mouth every 6 (six) hours as needed for moderate pain.   Yes [provider]  aspirin EC 81 MG tablet Take 81 mg by mouth at bedtime.   Yes [provider]  empagliflozin (JARDIANCE) 25 MG TABS tablet  Take 25 mg by mouth every morning.   Yes [provider]  metFORMIN (GLUCOPHAGE) 1000 MG tablet Take 1,000 mg by mouth 2 (two) times daily with a meal.    Yes [provider]  pantoprazole (PROTONIX) 40 MG tablet Take 40 mg by mouth daily as needed (for acid reflux).    Yes [provider]  rosuvastatin (CRESTOR) 20 MG tablet Take 20 mg by mouth at bedtime.   Yes [provider]    Allergies    Patient has no known allergies.  Review of Systems   Review of Systems  Constitutional: Negative.   HENT: Negative.   Respiratory: Negative.   Cardiovascular: Negative.   Gastrointestinal: Negative.   Genitourinary: Negative.   Musculoskeletal: Negative.   Skin: Positive for wound.  Neurological: Negative.   All other systems reviewed and are negative.   Physical Exam Updated Vital Signs BP (!) 129/102 (BP Location: Right Arm)   Pulse 77   Temp 97.8 F (36.6 C) (Oral)   Resp 17   Ht 5\' 8"  (1.727 m)   Wt 60.3 kg   SpO2 94%   BMI 20.22 kg/m   Physical Exam Vitals and nursing note reviewed.  Constitutional:      General: He is not in acute distress.    Appearance: He is well-developed. He is not ill-appearing, toxic-appearing or diaphoretic.  HENT:     Head:  Normocephalic. Laceration present. No raccoon eyes, Battle's sign, right periorbital erythema or left periorbital erythema.     Jaw: There is normal jaw occlusion.      Comments: 3 3 mm laceration to left eyebrow.  No active bleeding or drainage.  Mild overlying ecchymosis to left brow bridge however no crepitus or step-offs.  No battle sign or raccoon eyes    Right Ear: No hemotympanum.     Left Ear: No hemotympanum.     Nose: Nose normal.     Comments: No septal hematoma.  No tenderness, crepitus or step-offs    Mouth/Throat:     Lips: Pink.     Mouth: Mucous membranes are moist.     Pharynx: Oropharynx is clear. Uvula midline.     Comments: Dentition intact.  No evidence of intraoral  trauma. Eyes:     Extraocular Movements: Extraocular movements intact.     Conjunctiva/sclera: Conjunctivae normal.     Pupils: Pupils are equal, round, and reactive to light.     Visual Fields: Right eye visual fields normal and left eye visual fields normal.     Comments: EOMs intact.  No pain with range of motion  Neck:     Trachea: Phonation normal.     Comments: No midline neck pain, neck stiffness or neck rigidity Cardiovascular:     Rate and Rhythm: Normal rate and regular rhythm.     Pulses: Normal pulses.          Radial pulses are 2+ on the right side and 2+ on the left side.       Dorsalis pedis pulses are 2+ on the right side and 2+ on the left side.  Pulmonary:     Effort: Pulmonary effort is normal. No respiratory distress.     Breath sounds: Normal breath sounds and air entry.     Comments: Speaks in full speaks in full sentences without difficulty.  Lungs clear to auscultation bilaterally Chest:     Comments: No crepitus, step-offs.  No overlying edema, erythema, warmth.  Equal rise and fall of chest Abdominal:     General: Bowel sounds are normal. There is no distension.     Palpations: Abdomen is soft.     Tenderness: There is no abdominal tenderness. There is no right CVA tenderness, left CVA tenderness, guarding or rebound. Negative signs include Murphy's sign and McBurney's sign.     Hernia: No hernia is present.     Comments: Soft, nontender without rebound or guarding  Musculoskeletal:        General: Normal range of motion.     Cervical back: Full passive range of motion without pain, normal range of motion and neck supple.     Comments: Moves all 4 extremities without difficulty.  No bony tenderness to extremities.  Pelvis stable, nontender palpation.  No midline spinal tenderness palpation.  Ambulatory with out difficulty.  Compartments soft  Skin:    General: Skin is warm and dry.     Capillary Refill: Capillary refill takes less than 2 seconds.      Comments: 3 mm laceration with skin tear to the left eyebrow.  No active bleeding or drainage.  Some surrounding ecchymosis  Neurological:     Mental Status: He is alert.     Comments: Mental Status:  Alert, oriented, thought content appropriate. Speech fluent without evidence of aphasia. Able to follow 2 step commands without difficulty.  Cranial Nerves:  II:  Peripheral visual fields grossly normal,  pupils equal, round, reactive to light III,IV, VI: ptosis not present, extra-ocular motions intact bilaterally  V,VII: smile symmetric, facial light touch sensation equal VIII: hearing grossly normal bilaterally  IX,X: midline uvula rise  XI: bilateral shoulder shrug equal and strong XII: midline tongue extension  Motor:  5/5 in upper and lower extremities bilaterally including strong and equal grip strength and dorsiflexion/plantar flexion Sensory: Pinprick and light touch normal in all extremities.  Deep Tendon Reflexes: 2+ and symmetric  Cerebellar: normal finger-to-nose with bilateral upper extremities Gait: normal gait and balance CV: distal pulses palpable throughout      ED Results / Procedures / Treatments   Labs (all labs ordered are listed, but only abnormal results are displayed) Labs Reviewed - No data to display  EKG None  Radiology CT Head Wo Contrast  Result Date: 09/20/2019 CLINICAL DATA:  Patient fell hitting left side of face EXAM: CT HEAD WITHOUT CONTRAST TECHNIQUE: Contiguous axial images were obtained from the base of the skull through the vertex without intravenous contrast. COMPARISON:  None. FINDINGS: Brain: No evidence of acute territorial infarction, hemorrhage, hydrocephalus,extra-axial collection or mass lesion/mass effect. Normal gray-white differentiation. Ventricles are normal in size and contour. Vascular: No hyperdense vessel or unexpected calcification. Skull: The skull is intact. No fracture or focal lesion identified. Sinuses/Orbits: The visualized  paranasal sinuses and mastoid air cells are clear. The orbits and globes intact. Other: Soft tissue swelling seen around the left periorbital region and nasal bridge. Face: Osseous: There appears to be a left-sided anterior cleft palate. A nondisplaced fracture seen through the left nasal bone is seen. Orbits: No fracture identified. Unremarkable appearance of globes and orbits. Sinuses: Small amount of ethmoid air cell mucosal thickening is noted. There is fluid in the nasopharynx. Soft tissues:  Left-sided periorbital and upper lip swelling. Limited intracranial: No acute findings. Cervical spine: Alignment: Physiologic Skull base and vertebrae: Visualized skull base is intact. No atlanto-occipital dissociation. The vertebral body heights are well maintained. No fracture or pathologic osseous lesion seen. Soft tissues and spinal canal: The visualized paraspinal soft tissues are unremarkable. No prevertebral soft tissue swelling is seen. The spinal canal is grossly unremarkable, no large epidural collection or significant canal narrowing. Disc levels: Mild cervical spine spondylosis is seen with disc height disc osteophyte complex and uncovertebral osteophytes most notable at C4-C5 with mild neural foraminal narrowing and central canal stenosis. Upper chest: The lung apices are clear. Thoracic inlet is within normal limits. Other: None IMPRESSION: No acute intracranial abnormality. Nondisplaced fracture through the left nasal bone Left cleft palate. No acute fracture or malalignment of the spine. Electronically Signed   By: Prudencio Pair M.D.   On: 09/20/2019 22:36   CT Cervical Spine Wo Contrast  Result Date: 09/20/2019 CLINICAL DATA:  Patient fell hitting left side of face EXAM: CT HEAD WITHOUT CONTRAST TECHNIQUE: Contiguous axial images were obtained from the base of the skull through the vertex without intravenous contrast. COMPARISON:  None. FINDINGS: Brain: No evidence of acute territorial infarction,  hemorrhage, hydrocephalus,extra-axial collection or mass lesion/mass effect. Normal gray-white differentiation. Ventricles are normal in size and contour. Vascular: No hyperdense vessel or unexpected calcification. Skull: The skull is intact. No fracture or focal lesion identified. Sinuses/Orbits: The visualized paranasal sinuses and mastoid air cells are clear. The orbits and globes intact. Other: Soft tissue swelling seen around the left periorbital region and nasal bridge. Face: Osseous: There appears to be a left-sided anterior cleft palate. A nondisplaced fracture seen through the left nasal  bone is seen. Orbits: No fracture identified. Unremarkable appearance of globes and orbits. Sinuses: Small amount of ethmoid air cell mucosal thickening is noted. There is fluid in the nasopharynx. Soft tissues:  Left-sided periorbital and upper lip swelling. Limited intracranial: No acute findings. Cervical spine: Alignment: Physiologic Skull base and vertebrae: Visualized skull base is intact. No atlanto-occipital dissociation. The vertebral body heights are well maintained. No fracture or pathologic osseous lesion seen. Soft tissues and spinal canal: The visualized paraspinal soft tissues are unremarkable. No prevertebral soft tissue swelling is seen. The spinal canal is grossly unremarkable, no large epidural collection or significant canal narrowing. Disc levels: Mild cervical spine spondylosis is seen with disc height disc osteophyte complex and uncovertebral osteophytes most notable at C4-C5 with mild neural foraminal narrowing and central canal stenosis. Upper chest: The lung apices are clear. Thoracic inlet is within normal limits. Other: None IMPRESSION: No acute intracranial abnormality. Nondisplaced fracture through the left nasal bone Left cleft palate. No acute fracture or malalignment of the spine. Electronically Signed   By: Prudencio Pair M.D.   On: 09/20/2019 22:36   CT Maxillofacial Wo Contrast  Result  Date: 09/20/2019 CLINICAL DATA:  Patient fell hitting left side of face EXAM: CT HEAD WITHOUT CONTRAST TECHNIQUE: Contiguous axial images were obtained from the base of the skull through the vertex without intravenous contrast. COMPARISON:  None. FINDINGS: Brain: No evidence of acute territorial infarction, hemorrhage, hydrocephalus,extra-axial collection or mass lesion/mass effect. Normal gray-white differentiation. Ventricles are normal in size and contour. Vascular: No hyperdense vessel or unexpected calcification. Skull: The skull is intact. No fracture or focal lesion identified. Sinuses/Orbits: The visualized paranasal sinuses and mastoid air cells are clear. The orbits and globes intact. Other: Soft tissue swelling seen around the left periorbital region and nasal bridge. Face: Osseous: There appears to be a left-sided anterior cleft palate. A nondisplaced fracture seen through the left nasal bone is seen. Orbits: No fracture identified. Unremarkable appearance of globes and orbits. Sinuses: Small amount of ethmoid air cell mucosal thickening is noted. There is fluid in the nasopharynx. Soft tissues:  Left-sided periorbital and upper lip swelling. Limited intracranial: No acute findings. Cervical spine: Alignment: Physiologic Skull base and vertebrae: Visualized skull base is intact. No atlanto-occipital dissociation. The vertebral body heights are well maintained. No fracture or pathologic osseous lesion seen. Soft tissues and spinal canal: The visualized paraspinal soft tissues are unremarkable. No prevertebral soft tissue swelling is seen. The spinal canal is grossly unremarkable, no large epidural collection or significant canal narrowing. Disc levels: Mild cervical spine spondylosis is seen with disc height disc osteophyte complex and uncovertebral osteophytes most notable at C4-C5 with mild neural foraminal narrowing and central canal stenosis. Upper chest: The lung apices are clear. Thoracic inlet is  within normal limits. Other: None IMPRESSION: No acute intracranial abnormality. Nondisplaced fracture through the left nasal bone Left cleft palate. No acute fracture or malalignment of the spine. Electronically Signed   By: Prudencio Pair M.D.   On: 09/20/2019 22:36    Procedures Procedures (including critical care time)  Medications Ordered in ED Medications - No data to display  ED Course  I have reviewed the triage vital signs and the nursing notes.  Pertinent labs & imaging results that were available during my care of the patient were reviewed by me and considered in my medical decision making (see chart for details).  64 year old male presents for evaluation after mechanical fall.  Patient with laceration and ecchymosis to left superior  periorbital region and into eyebrow.  He has a nonfocal neuro exam without deficits.  Denies LOC or anticoagulation.  No midline spinal tenderness.  Moves all 4 extremities without difficulty.  Normal musculoskeletal exam.  No bony tenderness to extremities.  Heart and lungs clear.  Abdomen soft, nontender.  Ambulatory prior to arrival to the ED, no emesis.  EOMs intact without pain, no evidence of orbital entrapment.  No septal hematoma, dentition intact.  CT imaging with nondisplaced nasal fracture, cleft palate  Laceration sutured with Dermabond.  Tetanus updated in the ED.  Patient with continued nonfocal neuro exam without deficits.  Discussed symptomatic management and anticipatory guidance at home.  Will follow up with ENT for nasal fracture.  Return for any worsening symptoms.  The patient has been appropriately medically screened and/or stabilized in the ED. I have low suspicion for any other emergent medical condition which would require further screening, evaluation or treatment in the ED or require inpatient management.  Patient is hemodynamically stable and in no acute distress.  Patient able to ambulate in department prior to ED.  Evaluation  does not show acute pathology that would require ongoing or additional emergent interventions while in the emergency department or further inpatient treatment.  I have discussed the diagnosis with the patient and answered all questions.  Pain is been managed while in the emergency department and patient has no further complaints prior to discharge.  Patient is comfortable with plan discussed in room and is stable for discharge at this time.  I have discussed strict return precautions for returning to the emergency department.  Patient was encouraged to follow-up with PCP/specialist refer to at discharge.    MDM Rules/Calculators/A&P                           Final Clinical Impression(s) / ED Diagnoses Final diagnoses:  Fall, initial encounter  Facial laceration, initial encounter  Closed fracture of nasal bone, initial encounter    Rx / DC Orders ED Discharge Orders    None       Pollyann Roa A, PA-C 09/20/19 2303    Milton Ferguson, MD 09/22/19 1032

## 2019-09-20 NOTE — ED Notes (Signed)
Pt holding towel with ice to laceration at this time. Bleeding is controlled. A&O x4.

## 2019-09-20 NOTE — Discharge Instructions (Signed)
Try not to wet the Dermabond for 24 hours.  After that may let warm soapy water run over the area.  If he starts to bleed hold pressure.  You may follow-up with the ear nose and throat providers regarding your nasal fracture.  You may take Tylenol ibuprofen as needed for pain.  You may notice additional swelling and bruising to your face.  This is normal.  Return for any worsening symptoms.

## 2019-09-20 NOTE — ED Triage Notes (Signed)
Pt fell tonight hitting the left side of his face on concrete. Pt has a laceration to left brow. Pt denies LOC. Denies being on blood thinners, but his platelets run low.

## 2019-10-02 MED FILL — JARDIANCE 25 MG TABLET: 25 | 30 days supply | Qty: 30 | Fill #3

## 2019-10-08 MED FILL — METFORMIN HCL 1000 MG TABS: 1000 | 90 days supply | Qty: 180 | Fill #3

## 2019-11-03 MED FILL — JARDIANCE 25 MG TABLET: 25 | 30 days supply | Qty: 30 | Fill #4

## 2019-11-15 DIAGNOSIS — Z20822 Contact with and (suspected) exposure to covid-19: Secondary | ICD-10-CM | POA: Diagnosis not present

## 2019-11-15 DIAGNOSIS — Z03818 Encounter for observation for suspected exposure to other biological agents ruled out: Secondary | ICD-10-CM | POA: Diagnosis not present

## 2019-12-04 DIAGNOSIS — H524 Presbyopia: Secondary | ICD-10-CM | POA: Diagnosis not present

## 2019-12-04 DIAGNOSIS — H35371 Puckering of macula, right eye: Secondary | ICD-10-CM | POA: Diagnosis not present

## 2019-12-04 MED FILL — ROSUVASTATIN CALCIUM 20 MG: 20 | 90 days supply | Qty: 90 | Fill #2

## 2019-12-13 MED FILL — JARDIANCE 25 MG TABLET: 25 | 90 days supply | Qty: 90 | Fill #5

## 2019-12-15 MED FILL — ROSUVASTATIN CALCIUM 20 MG: 20 | 90 days supply | Qty: 90 | Fill #2

## 2019-12-18 DIAGNOSIS — G4730 Sleep apnea, unspecified: Secondary | ICD-10-CM | POA: Diagnosis not present

## 2019-12-18 DIAGNOSIS — G3184 Mild cognitive impairment, so stated: Secondary | ICD-10-CM | POA: Diagnosis not present

## 2020-01-05 ENCOUNTER — Other Ambulatory Visit (HOSPITAL_COMMUNITY): Payer: Self-pay | Admitting: Internal Medicine

## 2020-01-05 MED FILL — FREESTYLE LITE TEST STRIP: 90 days supply | Qty: 100 | Fill #0

## 2020-01-14 MED FILL — METFORMIN HCL 1000 MG TABS: 1000 | 90 days supply | Qty: 180 | Fill #0

## 2020-02-02 ENCOUNTER — Encounter: Payer: Self-pay | Admitting: Endocrinology

## 2020-02-02 ENCOUNTER — Other Ambulatory Visit: Payer: Self-pay | Admitting: *Deleted

## 2020-02-02 ENCOUNTER — Ambulatory Visit: Payer: 59 | Admitting: Endocrinology

## 2020-02-02 ENCOUNTER — Other Ambulatory Visit: Payer: Self-pay

## 2020-02-02 ENCOUNTER — Other Ambulatory Visit: Payer: Self-pay | Admitting: Endocrinology

## 2020-02-02 VITALS — BP 120/70 | Ht 68.0 in | Wt 138.4 lb

## 2020-02-02 DIAGNOSIS — E1165 Type 2 diabetes mellitus with hyperglycemia: Secondary | ICD-10-CM

## 2020-02-02 DIAGNOSIS — E7800 Pure hypercholesterolemia, unspecified: Secondary | ICD-10-CM

## 2020-02-02 DIAGNOSIS — R634 Abnormal weight loss: Secondary | ICD-10-CM

## 2020-02-02 LAB — POCT GLYCOSYLATED HEMOGLOBIN (HGB A1C): Hemoglobin A1C: 7.4 % — AB (ref 4.0–5.6)

## 2020-02-02 MED ORDER — EMPAGLIFLOZIN 10 MG PO TABS: 10.0000 mg | ORAL_TABLET | Freq: Every day | ORAL | 3 refills | Status: DC

## 2020-02-02 MED ORDER — PIOGLITAZONE HCL 30 MG PO TABS: 30.0000 mg | ORAL_TABLET | Freq: Every day | ORAL | 2 refills | Status: DC

## 2020-02-02 MED ORDER — FREESTYLE LITE TEST VI STRP: 1.0000 | ORAL_STRIP | 1 refills | Status: AC | PRN

## 2020-02-02 MED FILL — PIOGLITAZONE HCL 30 MG TABS: 30 | 30 days supply | Qty: 30 | Fill #0

## 2020-02-02 NOTE — Patient Instructions (Addendum)
Check blood sugars on waking up 2-3 days a week  Also check blood sugars about 2 hours after meals and do this after different meals by rotation  Recommended blood sugar levels on waking up are 90-130 and about 2 hours after meal is 130-160  Please bring your blood sugar monitor to each visit, thank you  Cut Jardiance in 1/2

## 2020-02-02 NOTE — Progress Notes (Signed)
Patient ID: Joshua Lamb, male   DOB: 17-Aug-1955, 64 y.o.   MRN: [id number]           Reason for Appointment: Consultation for Type 2 Diabetes  Referring PCP: Marton Redwood   History of Present Illness:          Date of diagnosis of type 2 diabetes mellitus:   2017    Background history:  He was diagnosed when he had blood sugars above normal without any symptoms He believes his highest A1c was 7.8 Initially was treated with Metformin and apparently because of inadequate level of control he was given 25 mg Jardiance also probably in 2017 Apparently his weight prior to starting Jardiance was 190 pounds He progressively lost weight with this and his A1c has been between 6.3 and 7.3 since 2020  Recent history:   Most recent A1c is 7.4, previously 7.2   Non-insulin hypoglycemic drugs the patient is taking are: Metformin 1 g twice daily, Jardiance 25 mg daily  Current management, blood sugar patterns and problems identified:  He only recently started rechecking his blood sugars but did have expired test strips until a couple of weeks ago  He was getting  blood sugars ranging from 59-171 before getting new test strips  However he checks blood sugars only in the morning; more recently blood sugars have been between about 111-130  He is very active with exercising and strength training as well as mowing the lawn  Has more carbohydrate at lunch if eating 2 sandwiches, sometimes he will have only oatmeal with juice with low protein  Evening meal is relatively lighter        Side effects from medications have been:      Meal times are:  Breakfast is at 7 AM, dinner 6:30 PM  Typical meal intake: Breakfast is   toast with eggs and sausage or oatmeal with 4 ounces of juice.  Dinner usually salad, vegetables and protein.  Snacks apple or peanuts     Only occasionally will go to fast food restaurants          Glucose monitoring:  done <1 times a day         Glucometer:  Freestyle  lite     Blood Glucose readings by review of meter  Recent blood sugar range 111-131 with average 127  All readings fasting  Dietician visit, most recent: 2017  Weight history:  Wt Readings from Last 3 Encounters:  02/02/20 138 lb 6.4 oz (62.8 kg)  09/20/19 133 lb (60.3 kg)  06/03/19 145 lb (65.8 kg)    Glycemic control:   Lab Results  Component Value Date   HGBA1C 7.4 (A) 02/02/2020   HGBA1C 7.2 08/07/2019   Lab Results  Component Value Date   MICROALBUR 24 08/14/2019   LDLCALC 70 08/07/2019   CREATININE 0.8 08/07/2019   No results found for: MICRALBCREAT  No results found for: FRUCTOSAMINE  Office Visit on 02/02/2020  Component Date Value Ref Range Status  . Microalb, Ur 08/14/2019 24   Final  . Creatinine 08/07/2019 0.8  0.6 - 1.3 Final  . Triglycerides 08/07/2019 62  40 - 160 Final  . LDL Cholesterol 08/07/2019 70   Final  . Hemoglobin A1C 08/07/2019 7.2   Final  . Hemoglobin A1C 02/02/2020 7.4* 4.0 - 5.6 % Final    Allergies as of 02/02/2020   No Known Allergies     Medication List       Accurate as of  February 02, 2020  3:51 PM. If you have any questions, ask your nurse or doctor.        acetaminophen 500 MG tablet Commonly known as: TYLENOL Take 1,000 mg by mouth every 6 (six) hours as needed for moderate pain.   aspirin EC 81 MG tablet Take 81 mg by mouth at bedtime.   empagliflozin 10 MG Tabs tablet Commonly known as: Jardiance Take 1 tablet (10 mg total) by mouth daily with breakfast. What changed:   medication strength  how much to take  when to take this Changed by: Elayne Snare, MD   FREESTYLE LITE test strip Generic drug: glucose blood 1 each by Other route as needed for other. Use as instructed to check blood sugar once a day dx code E11.65 What changed: additional instructions Changed by: PEDRAZA, RHONDA, CMA   metFORMIN 1000 MG tablet Commonly known as: GLUCOPHAGE Take 1,000 mg by mouth 2 (two) times daily with a meal.    pantoprazole 40 MG tablet Commonly known as: PROTONIX Take 40 mg by mouth daily as needed (for acid reflux).   pioglitazone 30 MG tablet Commonly known as: Actos Take 1 tablet (30 mg total) by mouth daily. Started by: Elayne Snare, MD   rosuvastatin 20 MG tablet Commonly known as: CRESTOR Take 20 mg by mouth at bedtime.       Allergies: No Known Allergies  Past Medical History:  Diagnosis Date  . Arthritis   . Diabetes mellitus without complication ([place])   . GERD (gastroesophageal reflux disease)   . High cholesterol     Past Surgical History:  Procedure Laterality Date  . CLEFT LIP REPAIR    . CLEFT PALATE REPAIR      Family History  Problem Relation Age of Onset  . Diabetes Father   . Heart disease Neg Hx     Social History:  reports that he has never smoked. He has never used smokeless tobacco. He reports current alcohol use. He reports that he does not use drugs.   Review of Systems  Constitutional: Negative for reduced appetite.  Respiratory: Negative for shortness of breath.        Prior history of snoring and sleep apnea  Cardiovascular: Negative for leg swelling.  Gastrointestinal: Negative for diarrhea.  Endocrine: Negative for fatigue.  Genitourinary: Negative for frequency.  Musculoskeletal: Negative for joint pain.  Neurological: Negative for weakness and numbness.       Rarely any pain feet.  Waiting to see neurology for mild cognitive dysfunction  Psychiatric/Behavioral: Negative for depressed mood.     Lipid history: Lipids controlled with 20 mg Crestor    Lab Results  Component Value Date   LDLCALC 70 08/07/2019   TRIG 62 08/07/2019           Hypertension: Has been not been present  BP Readings from Last 3 Encounters:  02/02/20 120/70  09/20/19 131/76  02/15/18 121/82    Most recent eye exam was in 9/21  Most recent foot exam: 11/21  Currently known complications of diabetes: None  LABS:  Office Visit on 02/02/2020   Component Date Value Ref Range Status  . Microalb, Ur 08/14/2019 24   Final  . Creatinine 08/07/2019 0.8  0.6 - 1.3 Final  . Triglycerides 08/07/2019 62  40 - 160 Final  . LDL Cholesterol 08/07/2019 70   Final  . Hemoglobin A1C 08/07/2019 7.2   Final  . Hemoglobin A1C 02/02/2020 7.4* 4.0 - 5.6 % Final  Physical Examination:  BP 120/70   Ht 5\' 8"  (1.727 m)   Wt 138 lb 6.4 oz (62.8 kg)   BMI 21.04 kg/m   GENERAL:    Somewhat asthenic looking  HEENT:         Eye exam shows normal external appearance.  Fundus exam shows no retinopathy.  Oral exam    NECK:   There is no lymphadenopathy  Thyroid is not enlarged and no nodules felt.   Carotids are normal to palpation and no bruit heard  LUNGS:         Chest is symmetrical. Lungs are clear to auscultation.Marland Kitchen   HEART:         Heart sounds:  S1 and S2 are normal. No murmur or click heard., no S3 or S4.   ABDOMEN:   There is no distention present. Liver and spleen are not palpable.  No other mass or tenderness present.    NEUROLOGICAL:   Ankle jerks are absent bilaterally.    Diabetic Foot Exam - Simple   Simple Foot Form Diabetic Foot exam was performed with the following findings: Yes   Visual Inspection No deformities, no ulcerations, no other skin breakdown bilaterally: Yes Sensation Testing Intact to touch and monofilament testing bilaterally: Yes Pulse Check Posterior Tibialis and Dorsalis pulse intact bilaterally: Yes Comments              MUSCULOSKELETAL:  Mild swelling of finger joints    EXTREMITIES:     There is no ankle edema.   SKIN:       No rash or lesions of concern.        ASSESSMENT:  Diabetes type 2 non-insulin-dependent  See history of present illness for detailed discussion of current diabetes management, blood sugar patterns and problems identified  Most recent A1c is 7.4  Current treatment regimen is with Metformin and Jardiance 25 mg  Also his blood sugars are reasonably controlled he  may have postprandial hyperglycemia as stated by his A1c and fairly good fasting readings recently Currently not monitoring reading after meals He has had excessive weight loss from [place] although not clear if Metformin is also playing a role He is fairly active with regular exercise daily and usually getting balanced meals  Complications of diabetes: None evident  Hyperlipidemia, well controlled  Mild cognitive dysfunction: May be related to small vessel disease, waiting for consultation with neurologist  PLAN:    . Glucose monitoring: Patient advised to check readings either fasting or 2 hours after meals instead of just in the morning  . Diabetes education: Patient will benefit from consultation with nutritionist and will reassess the need on the next visit  . Lifestyle changes: Dietary changes: May need to reduce some carbohydrates if blood sugars are consistently high after given meal, may need to add more protein to breakfast and eating oatmeal   . Therapy changes: Reduce Jardiance to 10 mg, meanwhile he can take half of the 25 mg Start Actos 30 mg daily, this should help with insulin resistance, provide some stabilization of his weight and add cardiovascular risk reduction Explained that this may occasionally cause edema and he will let us know if this is a problem.  Since he has been on Jardiance there should be minimal Also should be able to tolerate it well without fluid retention with no known history of CHF  Continue Metformin 1 g twice daily  . Preventive care is up-to-date   Follow-up: 1 month    Patient  Instructions  Check blood sugars on waking up 2-3 days a week  Also check blood sugars about 2 hours after meals and do this after different meals by rotation  Recommended blood sugar levels on waking up are 90-130 and about 2 hours after meal is 130-160  Please bring your blood sugar monitor to each visit, thank you  Cut Jardiance in  1/2    Consultation note has been sent to the referring physician  Elayne Snare 02/02/2020, 3:51 PM   Note: This office note was prepared with Dragon voice recognition system technology. Any transcriptional errors that result from this process are unintentional.

## 2020-02-05 ENCOUNTER — Institutional Professional Consult (permissible substitution): Payer: 59 | Admitting: Neurology

## 2020-02-09 DIAGNOSIS — G4737 Central sleep apnea in conditions classified elsewhere: Secondary | ICD-10-CM | POA: Diagnosis not present

## 2020-02-09 DIAGNOSIS — G4733 Obstructive sleep apnea (adult) (pediatric): Secondary | ICD-10-CM | POA: Diagnosis not present

## 2020-02-13 ENCOUNTER — Other Ambulatory Visit (HOSPITAL_BASED_OUTPATIENT_CLINIC_OR_DEPARTMENT_OTHER): Payer: Self-pay

## 2020-02-13 DIAGNOSIS — R0683 Snoring: Secondary | ICD-10-CM

## 2020-02-13 DIAGNOSIS — G4710 Hypersomnia, unspecified: Secondary | ICD-10-CM

## 2020-02-16 ENCOUNTER — Encounter: Payer: Self-pay | Admitting: Neurology

## 2020-02-17 ENCOUNTER — Ambulatory Visit: Payer: 59 | Admitting: Neurology

## 2020-02-17 ENCOUNTER — Encounter: Payer: Self-pay | Admitting: Neurology

## 2020-02-17 ENCOUNTER — Other Ambulatory Visit: Payer: Self-pay | Admitting: Neurology

## 2020-02-17 VITALS — BP 138/73 | HR 80 | Ht 68.0 in | Wt 139.0 lb

## 2020-02-17 DIAGNOSIS — R634 Abnormal weight loss: Secondary | ICD-10-CM

## 2020-02-17 DIAGNOSIS — R41842 Visuospatial deficit: Secondary | ICD-10-CM | POA: Diagnosis not present

## 2020-02-17 DIAGNOSIS — R4189 Other symptoms and signs involving cognitive functions and awareness: Secondary | ICD-10-CM

## 2020-02-17 DIAGNOSIS — G4731 Primary central sleep apnea: Secondary | ICD-10-CM | POA: Diagnosis not present

## 2020-02-17 NOTE — Progress Notes (Signed)
SLEEP MEDICINE CLINIC    Provider:  Larey Seat, MD  Primary Care Physician:  Joshua Redwood, MD [place] [address]     Referring Provider: Marton Lamb, [place],  [place] [address]          Chief Complaint according to patient   Patient presents with:    . New Patient (Initial Visit)           HISTORY OF PRESENT ILLNESS:  Joshua Lints, MD,  is a 64  Year- old  Caucasian male patient seen here upon CONSULTATION requested  on 02/17/2020 from Dr. Brigitte Pulse.  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  Chief concern according to patient : " I can't play the same board games, having trouble keeping up" , he has had rouble with visual spatial interpretation.     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  He   has a past medical history of Arthritis, Diabetes mellitus without complication ([place]), GERD (gastroesophageal reflux disease), and High cholesterol.  Dr. Dwyane Dee follows his DM, after he lost 60 pounds ( reportedly) on metformin and jardiance). He has a history of central apnea. Dr. Kathrin Penner found no abnormalities in eye examination with visual fields.-he is enrolled in a brain health project. He has changed cognitively over the last 3 years, progressively.  He misjudges distance, has trouble to read ( lines swimming) ,   Family medical /sleep history: brother died with OSA.    Social history:  Patient is retired from Nature conservation officer, MD , trained at [employer],  and lives in a household with 2 persons/ spouse-. Family status is married with 3 adult children, 2 grandchildren.  The patient currently in retirement.  Pets are present. 2 dogs.  Tobacco use: never   ETOH use: none    Caffeine intake in form of Coffee( /) Soda(/) Tea ( occasional ) or energy drinks. Regular exercise in form of biking, garden work.  Hobbies : painting.     Sleep habits are as follows: The patient's dinner time is between 6-7 PM. The patient goes to bed at 9.30 PM and continues to sleep for 3-4 hours, wakes for one bathroom break, the first time at 3 AM.   The preferred sleep position is right side, with the support of 1 pillows.  Dreams are reportedly =frequent. No enactment.  5.30-6.30 AM is the usual rise time. The patient wakes up spontaneously.  He reports  feeling refreshed /restored in AM.   Review of Systems: Out of a complete 14 system review, the patient complains of only the following symptoms, and all other reviewed systems are negative.:  History of central apnea.   Memory impairment. visual spatial impairment.   Social History   Socioeconomic History  . Marital status: Married    Spouse name: Not on file  . Number of children: Not on file  . Years of education: Not on file  . Highest education level: Not on file  Occupational History  . Not on file  Tobacco Use  . Smoking status: Never Smoker  . Smokeless tobacco: Never Used  Substance and Sexual Activity  . Alcohol use: Yes    Comment: "rarely"  . Drug use:  No  . Sexual activity: Not on file  Other Topics Concern  . Not on file  Social History Narrative  . Not on file   Social Determinants of Health   Financial Resource Strain:   . Difficulty of Paying Living Expenses: Not on file  Food Insecurity:   . Worried About Charity fundraiser in the Last Year: Not on file  . Ran Out of Food in the Last Year: Not on file  Transportation Needs:   . Lack of Transportation (Medical): Not on file  . Lack of Transportation (Non-Medical): Not on file  Physical Activity:   . Days of Exercise per Week: Not on file  . Minutes of Exercise per Session: Not on file  Stress:   . Feeling of Stress : Not on file   Social Connections:   . Frequency of Communication with Friends and Family: Not on file  . Frequency of Social Gatherings with Friends and Family: Not on file  . Attends Religious Services: Not on file  . Active Member of Clubs or Organizations: Not on file  . Attends Archivist Meetings: Not on file  . Marital Status: Not on file    Family History  Problem Relation Age of Onset  . Diabetes Father   . Heart disease Neg Hx     Past Medical History:  Diagnosis Date  . Arthritis   . Diabetes mellitus without complication ([place])   . GERD (gastroesophageal reflux disease)   . High cholesterol     Past Surgical History:  Procedure Laterality Date  . CLEFT LIP REPAIR    . CLEFT PALATE REPAIR       Current Outpatient Medications on File Prior to Visit  Medication Sig Dispense Refill  . acetaminophen (TYLENOL) 500 MG tablet Take 1,000 mg by mouth every 6 (six) hours as needed for moderate pain.    Marland Kitchen aspirin EC 81 MG tablet Take 81 mg by mouth at bedtime.    . empagliflozin (JARDIANCE) 10 MG TABS tablet Take 1 tablet (10 mg total) by mouth daily with breakfast. 30 tablet 3  . glucose blood (FREESTYLE LITE) test strip 1 each by Other route as needed for other. Use as instructed to check blood sugar once a day dx code E11.65 100 each 1  . metFORMIN (GLUCOPHAGE) 1000 MG tablet Take 1,000 mg by mouth 2 (two) times daily with a meal.     . pantoprazole (PROTONIX) 40 MG tablet Take 40 mg by mouth daily as needed (for acid reflux).     . pioglitazone (ACTOS) 30 MG tablet Take 1 tablet (30 mg total) by mouth daily. 30 tablet 2  . rosuvastatin (CRESTOR) 20 MG tablet Take 20 mg by mouth at bedtime.     No current facility-administered medications on file prior to visit.    No Known Allergies  Physical exam:  Today's Vitals   02/17/20 1105  BP: 138/73  Pulse: 80  Weight: 139 lb (63 kg)  Height: 5\' 8"  (1.727 m)   Body mass index is 21.13 kg/m.   Wt Readings from Last 3  Encounters:  02/17/20 139 lb (63 kg)  02/02/20 138 lb 6.4 oz (62.8 kg)  09/20/19 133 lb (60.3 kg)     Ht Readings from Last 3 Encounters:  02/17/20 5\' 8"  (1.727 m)  02/02/20 5\' 8"  (1.727 m)  09/20/19 5\' 8"  (1.727 m)      General: The patient is awake, alert and appears not in acute distress.  The patient is well groomed. Head: Normocephalic, atraumatic. Neck is supple.  neck circumference:15 inches . Nasal airflow barley patent.  no retrognathia is -the has cleft lip- palate.   Dental status: inatct Cardiovascular:  Regular rate and cardiac rhythm by pulse,  without distended neck veins. Respiratory: Lungs are clear to auscultation.  Skin:  Without evidence of ankle edema, or rash. Trunk: The patient's posture is erect.   Neurologic exam : The patient is awake and alert, oriented to place and time.   Memory subjective described as impaired- Montreal Cognitive Assessment  02/17/2020  Visuospatial/ Executive (0/5) 2  Naming (0/3) 3  Attention: Read list of digits (0/2) 2  Attention: Read list of letters (0/1) 1  Attention: Serial 7 subtraction starting at 100 (0/3) 3  Language: Repeat phrase (0/2) 2  Language : Fluency (0/1) 1  Abstraction (0/2) 2  Delayed Recall (0/5) 3  Orientation (0/6) 6  Total 25    Attention span & concentration ability appears normal.  Speech is fluent,  without  dysarthria, dysphonia or aphasia.  Mood and affect are appropriate.   Cranial nerves: no loss of smell or taste reported  Pupils are equal and briskly reactive to light. Funduscopic exam normal.   Extraocular movements in vertical and horizontal planes were intact and without nystagmus. No Diplopia. Visual fields by finger perimetry are intact. Hearing was intact to soft voice and finger rubbing.   Facial sensation intact to fine touch.  Facial motor strength is symmetric and tongue and uvula move midline.  Neck ROM : rotation, tilt and flexion extension were normal for age and shoulder  shrug was symmetrical.    Motor exam:  Symmetric bulk, tone and ROM.   Normal tone without cog wheeling, symmetric grip strength . Sensory:  Fine touch, pinprick and vibration were tested  and  normal.  Proprioception tested in the upper extremities was normal. Coordination: Rapid alternating movements in the fingers/hands were of normal speed.  The Finger-to-nose maneuver was intact without evidence of ataxia, dysmetria or tremor. Gait and station: Patient could rise unassisted from a seated position, walked without assistive device.  Stance is of normal width/ base .bilaterally normal arm swing, the left shoulder is lower. .  Toe and heel walk were deferred.  Deep tendon reflexes: in the  upper and lower extremities are symmetric and intact.  Babinski response was deferred .      After spending a total time of 60 minutes face to face and additional time for physical and neurologic examination, review of laboratory studies,  personal review of imaging studies, reports and results of other testing and review of referral information / records as far as provided in visit, I have established the following assessments:  I have the pleasure of meeting today with Dr. Nicki Reaper L. Candis Schatz, MD and his wife..  Of about 3 years they have been aware of slight changes in Joshua Lamb ability to read, to interpret visual cues and visual spatial function.  There has been no problems with aphasia, the patient has no history of strokes.  He had a fall that may have led to a small concussion however this took place in June of this year and a CT obtained after that did not show intracranial injuries.  In addition there has been a history of well-controlled diabetes mellitus for several years but was changing medications a weight loss was set on cough but the patient did not desire to this degree.  He reportedly lost 16  pounds.  This was on a con [place] of Metformin and Jardiance and he is now followed by an  endocrinologist Dr. Dwyane Dee for the fine-tuning.  He has a history of complex sleep apnea for which CPAP was effective, until he felt his weight loss made it superfluous.  The sleep study took place in 2014.  I could not obtain results. Mrs. Divis felt that the weight loss and cognitive changes were taking place at the same time.    1) MOCA- showing visio-spatial primary deficits, ophthalmology evaluation was normal.  2) June 2021- fall- CT head was unremarkable.  3) central apnea- he needs re-evaluation.    My Plan is to proceed with:  1) MRI with volumetric, brain with and without.  2) no EEG needed.  3)  OT ( addressing penmanship, and leaving of the first letter)  ST - no need for Gait stabilization and turning- fall prevention.  He has hemineglect to the left side.    I would like to thank Joshua Redwood, MD for allowing me to meet with and to take care of this pleasant patient.   In short, Joshua Lamb is presenting with visiospatial - deficits.  I plan to follow up  personally  within 2-4 month.   CC: I will share my notes with PCP-.  Electronically signed by: Larey Seat, MD 02/17/2020 11:09 AM  [facility] Board certified by The [employer] of Sleep Medicine and Diplomate of the [employer] of Sleep Medicine. Board certified In Neurology through the [place], Fellow of the [employer] of Neurology. Medical Director of [facility].

## 2020-02-17 NOTE — Patient Instructions (Signed)

## 2020-02-18 ENCOUNTER — Telehealth: Payer: Self-pay | Admitting: Neurology

## 2020-02-18 NOTE — Telephone Encounter (Signed)
fax to [facility] cone if safe they will reach out to patient to schedule.

## 2020-02-19 NOTE — Telephone Encounter (Signed)
Scheduled at [facility] cone for 03/03/20.

## 2020-02-24 ENCOUNTER — Other Ambulatory Visit: Payer: 59

## 2020-02-25 ENCOUNTER — Other Ambulatory Visit (INDEPENDENT_AMBULATORY_CARE_PROVIDER_SITE_OTHER): Payer: 59

## 2020-02-25 ENCOUNTER — Other Ambulatory Visit: Payer: Self-pay

## 2020-02-25 DIAGNOSIS — E1165 Type 2 diabetes mellitus with hyperglycemia: Secondary | ICD-10-CM | POA: Diagnosis not present

## 2020-02-25 LAB — BASIC METABOLIC PANEL
BUN: 22 mg/dL (ref 6–23)
CO2: 31 mEq/L (ref 19–32)
Calcium: 9.9 mg/dL (ref 8.4–10.5)
Chloride: 101 mEq/L (ref 96–112)
Creatinine, Ser: 0.92 mg/dL (ref 0.40–1.50)
GFR: 87.19 mL/min (ref >60.00)
Glucose, Bld: 148 mg/dL — ABNORMAL HIGH (ref 70–99)
Potassium: 4.2 mEq/L (ref 3.5–5.1)
Sodium: 138 mEq/L (ref 135–145)

## 2020-02-26 LAB — FRUCTOSAMINE: Fructosamine: 280 umol/L (ref 0–285)

## 2020-02-27 MED FILL — PIOGLITAZONE HCL 30 MG TABS: 30 | 30 days supply | Qty: 30 | Fill #1

## 2020-03-01 ENCOUNTER — Ambulatory Visit (INDEPENDENT_AMBULATORY_CARE_PROVIDER_SITE_OTHER): Payer: 59 | Admitting: Neurology

## 2020-03-01 DIAGNOSIS — G4733 Obstructive sleep apnea (adult) (pediatric): Secondary | ICD-10-CM | POA: Diagnosis not present

## 2020-03-01 DIAGNOSIS — R41842 Visuospatial deficit: Secondary | ICD-10-CM

## 2020-03-01 DIAGNOSIS — R634 Abnormal weight loss: Secondary | ICD-10-CM

## 2020-03-01 DIAGNOSIS — G4731 Primary central sleep apnea: Secondary | ICD-10-CM

## 2020-03-01 DIAGNOSIS — G319 Degenerative disease of nervous system, unspecified: Secondary | ICD-10-CM

## 2020-03-03 ENCOUNTER — Ambulatory Visit (HOSPITAL_COMMUNITY)
Admission: RE | Admit: 2020-03-03 | Discharge: 2020-03-03 | Disposition: A | Discharge status: Home / Self Care | Payer: 59 | Source: Ambulatory Visit | Attending: Neurology | Admitting: Neurology

## 2020-03-03 ENCOUNTER — Other Ambulatory Visit: Payer: Self-pay

## 2020-03-03 ENCOUNTER — Encounter: Payer: Self-pay | Admitting: Endocrinology

## 2020-03-03 ENCOUNTER — Ambulatory Visit (INDEPENDENT_AMBULATORY_CARE_PROVIDER_SITE_OTHER): Payer: 59 | Admitting: Endocrinology

## 2020-03-03 VITALS — BP 108/62 | HR 69 | Ht 68.0 in | Wt 139.4 lb

## 2020-03-03 DIAGNOSIS — R41842 Visuospatial deficit: Secondary | ICD-10-CM | POA: Insufficient documentation

## 2020-03-03 DIAGNOSIS — H547 Unspecified visual loss: Secondary | ICD-10-CM | POA: Diagnosis not present

## 2020-03-03 DIAGNOSIS — R4189 Other symptoms and signs involving cognitive functions and awareness: Secondary | ICD-10-CM | POA: Diagnosis not present

## 2020-03-03 DIAGNOSIS — E1165 Type 2 diabetes mellitus with hyperglycemia: Secondary | ICD-10-CM

## 2020-03-03 DIAGNOSIS — J322 Chronic ethmoidal sinusitis: Secondary | ICD-10-CM | POA: Diagnosis not present

## 2020-03-03 DIAGNOSIS — G319 Degenerative disease of nervous system, unspecified: Secondary | ICD-10-CM | POA: Diagnosis not present

## 2020-03-03 DIAGNOSIS — H538 Other visual disturbances: Secondary | ICD-10-CM | POA: Diagnosis not present

## 2020-03-03 MED ORDER — GADOBUTROL 1 MMOL/ML IV SOLN
6.0000 mL | Freq: Once | INTRAVENOUS | Status: AC | PRN
  Administered 2020-03-03: 6 mL via INTRAVENOUS

## 2020-03-03 NOTE — Progress Notes (Signed)
Patient ID: Joshua Lamb, male   DOB: Mar 27, 1956, 63 y.o.   MRN: [id number]           Reason for Appointment: Consultation for Type 2 Diabetes  Referring PCP: Marton Redwood   History of Present Illness:          Date of diagnosis of type 2 diabetes mellitus:   2017    Background history:  He was diagnosed when he had blood sugars above normal without any symptoms He believes his highest A1c was 7.8 Initially was treated with Metformin and apparently because of inadequate level of control he was given 25 mg Jardiance also probably in 2017 Apparently his weight prior to starting Jardiance was 190 pounds He progressively lost weight with this and his A1c has been between 6.3 and 7.3 since 2020  Recent history:   Most recent A1c is 7.4, previously 7.2  Fructosamine is now 280  Non-insulin hypoglycemic drugs the patient is taking are: Metformin 1 g twice daily, Jardiance 12.5 mg daily, Actos 30 mg daily  Current management, blood sugar patterns and problems identified:  He was told to reduce his Jardiance and start Actos because of tendency to weight loss  His weight is up 1 pound  Blood sugars appear to be overall about the same as before  However previously was checking mostly fasting readings and now is doing more readings after dinner  Only occasionally will have a reading about 140 at night and morning sugars are fairly good; lab glucose was nonfasting  Evening meal is relatively lighter but he has not checked readings after lunch, sometimes will have 2 sandwiches at lunch  He likes to use his exercise bike and is very active        Side effects from medications have been: None     Meal times are:  Breakfast is at 7 AM, dinner 6:30 PM  Typical meal intake: Breakfast is   toast with eggs and sausage or oatmeal with 4 ounces of juice.  Dinner usually salad, vegetables and protein.  Snacks apple or peanuts     Only occasionally will go to fast food restaurants           Glucose monitoring:  done <1 times a day         Glucometer:  Freestyle lite     Blood Glucose readings by review of meter   PRE-MEAL Fasting Lunch Dinner Bedtime Overall  Glucose range:  112-131      Mean/median: 122     131   POST-MEAL PC Breakfast PC Lunch PC Dinner  Glucose range:  161    97-221  Mean/median:    134    Dietician visit, most recent: 2017  Weight history:  Wt Readings from Last 3 Encounters:  03/03/20 139 lb 6.4 oz (63.2 kg)  02/17/20 139 lb (63 kg)  02/02/20 138 lb 6.4 oz (62.8 kg)    Glycemic control:   Lab Results  Component Value Date   HGBA1C 7.4 (A) 02/02/2020   HGBA1C 7.2 08/07/2019   Lab Results  Component Value Date   MICROALBUR 24 08/14/2019   LDLCALC 70 08/07/2019   CREATININE 0.92 02/25/2020   No results found for: MICRALBCREAT  Lab Results  Component Value Date   FRUCTOSAMINE 280 02/25/2020    No visits with results within 1 Week(s) from this visit.  Latest known visit with results is:  Lab on 02/25/2020  Component Date Value Ref Range Status  . Fructosamine 02/25/2020  280  0 - 285 umol/L Final   Comment: Published reference interval for apparently healthy subjects between age 46 and 34 is 82 - 285 umol/L and in a poorly controlled diabetic population is 228 - 563 umol/L with a mean of 396 umol/L.   Marland Kitchen Sodium 02/25/2020 138  135 - 145 mEq/L Final  . Potassium 02/25/2020 4.2  3.5 - 5.1 mEq/L Final  . Chloride 02/25/2020 101  96 - 112 mEq/L Final  . CO2 02/25/2020 31  19 - 32 mEq/L Final  . Glucose, Bld 02/25/2020 148* 70 - 99 mg/dL Final  . BUN 02/25/2020 22  6 - 23 mg/dL Final  . Creatinine, Ser 02/25/2020 0.92  0.40 - 1.50 mg/dL Final  . GFR 02/25/2020 87.19  >60.00 mL/min Final   Calculated using the CKD-EPI Creatinine Equation (2021)  . Calcium 02/25/2020 9.9  8.4 - 10.5 mg/dL Final    Allergies as of 03/03/2020   No Known Allergies     Medication List       Accurate as of March 03, 2020  9:54 PM. If you  have any questions, ask your nurse or doctor.        acetaminophen 500 MG tablet Commonly known as: TYLENOL Take 1,000 mg by mouth every 6 (six) hours as needed for moderate pain.   aspirin EC 81 MG tablet Take 81 mg by mouth at bedtime.   empagliflozin 10 MG Tabs tablet Commonly known as: Jardiance Take 1 tablet (10 mg total) by mouth daily with breakfast. What changed:   how much to take  additional instructions   FREESTYLE LITE test strip Generic drug: glucose blood 1 each by Other route as needed for other. Use as instructed to check blood sugar once a day dx code E11.65   metFORMIN 1000 MG tablet Commonly known as: GLUCOPHAGE Take 1,000 mg by mouth 2 (two) times daily with a meal.   pantoprazole 40 MG tablet Commonly known as: PROTONIX Take 40 mg by mouth daily as needed (for acid reflux).   pioglitazone 30 MG tablet Commonly known as: Actos Take 1 tablet (30 mg total) by mouth daily.   rosuvastatin 20 MG tablet Commonly known as: CRESTOR Take 20 mg by mouth at bedtime.       Allergies: No Known Allergies  Past Medical History:  Diagnosis Date  . Arthritis   . Diabetes mellitus without complication ([place])   . GERD (gastroesophageal reflux disease)   . High cholesterol     Past Surgical History:  Procedure Laterality Date  . CLEFT LIP REPAIR    . CLEFT PALATE REPAIR      Family History  Problem Relation Age of Onset  . Diabetes Father   . Heart disease Neg Hx     Social History:  reports that he has never smoked. He has never used smokeless tobacco. He reports current alcohol use. He reports that he does not use drugs.   Review of Systems   Lipid history: Lipids controlled with 20 mg Crestor    Lab Results  Component Value Date   LDLCALC 70 08/07/2019   TRIG 62 08/07/2019           Hypertension: Has been not been present  BP Readings from Last 3 Encounters:  03/03/20 108/62  02/17/20 138/73  02/02/20 120/70    Most recent eye  exam was in 9/21  Most recent foot exam: 11/21  Currently known complications of diabetes: None  LABS:  No visits with results  within 1 Week(s) from this visit.  Latest known visit with results is:  Lab on 02/25/2020  Component Date Value Ref Range Status  . Fructosamine 02/25/2020 280  0 - 285 umol/L Final   Comment: Published reference interval for apparently healthy subjects between age 25 and 50 is 26 - 285 umol/L and in a poorly controlled diabetic population is 228 - 563 umol/L with a mean of 396 umol/L.   Marland Kitchen Sodium 02/25/2020 138  135 - 145 mEq/L Final  . Potassium 02/25/2020 4.2  3.5 - 5.1 mEq/L Final  . Chloride 02/25/2020 101  96 - 112 mEq/L Final  . CO2 02/25/2020 31  19 - 32 mEq/L Final  . Glucose, Bld 02/25/2020 148* 70 - 99 mg/dL Final  . BUN 02/25/2020 22  6 - 23 mg/dL Final  . Creatinine, Ser 02/25/2020 0.92  0.40 - 1.50 mg/dL Final  . GFR 02/25/2020 87.19  >60.00 mL/min Final   Calculated using the CKD-EPI Creatinine Equation (2021)  . Calcium 02/25/2020 9.9  8.4 - 10.5 mg/dL Final    Physical Examination:  BP 108/62   Pulse 69   Ht 5\' 8"  (1.727 m)   Wt 139 lb 6.4 oz (63.2 kg)   SpO2 95%   BMI 21.20 kg/m        ASSESSMENT:  Diabetes type 2 non-insulin-dependent  See history of present illness for detailed discussion of current diabetes management, blood sugar patterns and problems identified  Most recent A1c is 7.4 and fructosamine 280  Current treatment regimen is with Metformin and Jardiance 12.5 mg as well as Actos recently Blood sugars are similar to when he was on the higher dose of Jardiance He is concerned about previous tendency to weight loss with Jardiance and may be too soon to see any changes yet His blood sugars are near normal with overall average 131 including postprandial readings  PLAN:    No change in regimen When he finishes his 25 mg Jardiance tablets he will switch to 10 mg daily and continue Actos 30 mg of Metformin To  call if blood sugars are consistently higher or lower Avoid high carbohydrate meals if blood sugars are high postprandial especially after lunch Check blood sugars by rotation at different times Also may consider reducing Metformin    There are no Patient Instructions on file for this visit.    Elayne Snare 03/03/2020, 9:54 PM   Note: This office note was prepared with Dragon voice recognition system technology. Any transcriptional errors that result from this process are unintentional.

## 2020-03-03 NOTE — Progress Notes (Signed)
IMPRESSION:   1. Marked cortical atrophy in the bilateral parietooccipital regions, more pronounced on the right side.  2. Few punctate foci of T2 hyperintensity within the white matter of the cerebral hemispheres, nonspecific. 3. NeuroQuant volumetric analysis of the brain, see details on [employer].   Electronically Signed   By: Pedro Earls M.D.   This is a posterior brain atrophy. This would correspond to the visio-spatial problems, swimming lines when reading text, etc.   No strokes.

## 2020-03-08 ENCOUNTER — Encounter: Payer: Self-pay | Admitting: Internal Medicine

## 2020-03-08 ENCOUNTER — Other Ambulatory Visit: Payer: Self-pay

## 2020-03-08 ENCOUNTER — Inpatient Hospital Stay: Payer: 59 | Attending: Internal Medicine | Admitting: Internal Medicine

## 2020-03-08 DIAGNOSIS — G319 Degenerative disease of nervous system, unspecified: Secondary | ICD-10-CM | POA: Diagnosis not present

## 2020-03-08 MED FILL — ROSUVASTATIN CALCIUM 20 MG: 20 | 90 days supply | Qty: 90 | Fill #3

## 2020-03-09 ENCOUNTER — Encounter: Payer: Self-pay | Admitting: Neurology

## 2020-03-09 DIAGNOSIS — G319 Degenerative disease of nervous system, unspecified: Secondary | ICD-10-CM | POA: Insufficient documentation

## 2020-03-09 NOTE — Progress Notes (Signed)
[facility] [place], [place] [address] [phone]   New Patient Evaluation  Date of Service: 03/09/20 Patient Name: Joshua Lamb Patient MRN: [id number] Patient DOB: [birth year] Provider: Ventura Sellers, MD  Identifying Statement:  Joshua Lamb is a 64 y.o. male with visual and spatial deficits who presents for initial consultation and evaluation.    Referring Provider: Marton Redwood, MD [address] [place],  [place] [address]  History of Present Illness: The patient's records from the referring physician were obtained and reviewed and the patient interviewed to confirm this HPI.  Joshua Lamb presents today with his wife to review recent neurologic complaints.  They essentially describe a 3-4 years progressive history of impairment in visual and spatial processing.  This is most notable when playing games requiring this skillset such as banana-grams or crossword puzzles.  It does impair his driving somewhat as well, as he requires a co-passenger and only drives very short, familiar distances currently.  He was evaluated by a neurologist, and was alarmed when was unable to copy geometric figures or a clock face.  Logical processing and calculation, such as computing tip at restaurant, are also impaired. He recently completed MRI brain which will be reviewed today in person.  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  Medications: Current Outpatient Medications on File Prior to Visit  Medication Sig Dispense Refill  . acetaminophen (TYLENOL) 500 MG tablet Take 1,000 mg by mouth every 6 (six) hours as needed for moderate pain.    Marland Kitchen aspirin EC 81 MG tablet Take 81 mg by mouth at bedtime.    . empagliflozin (JARDIANCE) 10 MG TABS tablet Take 1 tablet (10 mg total) by mouth daily with breakfast. (Patient taking differently: Take  25 mg by mouth daily with breakfast. Takes 1/2 tablet) 30 tablet 3  . glucose blood (FREESTYLE LITE) test strip 1 each by Other route as needed for other. Use as instructed to check blood sugar once a day dx code E11.65 100 each 1  . metFORMIN (GLUCOPHAGE) 1000 MG tablet Take 1,000 mg by mouth 2 (two) times daily with a meal.     . pioglitazone (ACTOS) 30 MG tablet Take 1 tablet (30 mg total) by mouth daily. 30 tablet 2  . rosuvastatin (CRESTOR) 20 MG tablet Take 20 mg by mouth at bedtime.    . pantoprazole (PROTONIX) 40 MG tablet Take 40 mg by mouth daily as needed (for acid reflux).  (Patient not taking: Reported on 03/08/2020)     No current facility-administered medications on file prior to visit.    Allergies: No Known Allergies Past Medical History:  Past Medical History:  Diagnosis Date  . Arthritis   . Diabetes mellitus without complication ([place])   . GERD (gastroesophageal reflux disease)   . High cholesterol    Past Surgical History:  Past Surgical History:  Procedure Laterality Date  . CLEFT LIP REPAIR    . CLEFT PALATE REPAIR     Social History:  Social History   Socioeconomic History  . Marital status: Married    Spouse name: Not on file  . Number of children: Not on file  . Years of education: Not on file  . Highest education level: Not on file  Occupational History  . Not on file  Tobacco Use  . Smoking status: Never Smoker  . Smokeless tobacco:  Never Used  Substance and Sexual Activity  . Alcohol use: Yes    Comment: "rarely"  . Drug use: No  . Sexual activity: Not on file  Other Topics Concern  . Not on file  Social History Narrative  . Not on file   Social Determinants of Health   Financial Resource Strain:   . Difficulty of Paying Living Expenses: Not on file  Food Insecurity:   . Worried About Charity fundraiser in the Last Year: Not on file  . Ran Out of Food in the Last Year: Not on file  Transportation Needs:   . Lack of Transportation  (Medical): Not on file  . Lack of Transportation (Non-Medical): Not on file  Physical Activity:   . Days of Exercise per Week: Not on file  . Minutes of Exercise per Session: Not on file  Stress:   . Feeling of Stress : Not on file  Social Connections:   . Frequency of Communication with Friends and Family: Not on file  . Frequency of Social Gatherings with Friends and Family: Not on file  . Attends Religious Services: Not on file  . Active Member of Clubs or Organizations: Not on file  . Attends Archivist Meetings: Not on file  . Marital Status: Not on file  Intimate Partner Violence:   . Fear of Current or Ex-Partner: Not on file  . Emotionally Abused: Not on file  . Physically Abused: Not on file  . Sexually Abused: Not on file   Family History:  Family History  Problem Relation Age of Onset  . Diabetes Father   . Heart disease Neg Hx     Review of Systems: Constitutional: Doesn't report fevers, chills or abnormal weight loss Eyes: Doesn't report blurriness of vision Ears, nose, mouth, throat, and face: Doesn't report sore throat Respiratory: Doesn't report cough, dyspnea or wheezes Cardiovascular: Doesn't report palpitation, chest discomfort  Gastrointestinal:  Doesn't report nausea, constipation, diarrhea GU: Doesn't report incontinence Skin: Doesn't report skin rashes Neurological: Per HPI Musculoskeletal: Doesn't report joint pain Behavioral/Psych: Doesn't report anxiety  Physical Exam: Vitals:   03/08/20 1518  BP: 106/64  Pulse: 65  Resp: 18  Temp: 98.1 F (36.7 C)  SpO2: 96%   KPS: 80. General: Alert, cooperative, pleasant, in no acute distress Head: Normal EENT: No conjunctival injection or scleral icterus.  Lungs: Resp effort normal Cardiac: Regular rate Abdomen: Non-distended abdomen Skin: No rashes cyanosis or petechiae. Extremities: No clubbing or edema  Neurologic Exam: Mental Status: Awake, alert, attentive to examiner. Oriented  to self and environment. Language is fluent with intact comprehension.  Cranial Nerves: Visual acuity is grossly normal. Visual fields are full. Extra-ocular movements intact. No ptosis. Face is symmetric Motor: Tone and bulk are normal. Power is full in both arms and legs. Reflexes are symmetric, no pathologic reflexes present.  Sensory: Intact to light touch Gait: Normal.   Labs: I have reviewed the data as listed    Component Value Date/Time   NA 138 02/25/2020 0812   K 4.2 02/25/2020 0812   CL 101 02/25/2020 0812   CO2 31 02/25/2020 0812   GLUCOSE 148 (H) 02/25/2020 0812   BUN 22 02/25/2020 0812   CREATININE 0.92 02/25/2020 0812   CALCIUM 9.9 02/25/2020 0812   Lab Results  Component Value Date   HGB 14.3 08/08/2014   HCT 42.0 08/08/2014    Imaging:  MR BRAIN W WO CONTRAST  Result Date: 03/03/2020 CLINICAL DATA:  Visual  spatial impairment. EXAM: MRI HEAD WITHOUT AND WITH CONTRAST TECHNIQUE: Multiplanar, multiecho pulse sequences of the brain and surrounding structures were obtained without and with intravenous contrast. Additionally, using NeuroQuant software a 3D volumetric analysis of the brain was performed and is compared to a normative database adjusted for age, gender and intracranial volume. CONTRAST:  53mL GADAVIST GADOBUTROL 1 MMOL/ML IV SOLN COMPARISON:  Head CT September 20, 2019. FINDINGS: Brain: No acute infarction, hemorrhage, hydrocephalus, extra-axial collection or mass lesion. Few punctate foci of T2 hyperintensity are seen within the white matter of the cerebral hemispheres, nonspecific. Marked cortical atrophy in the bilateral parietooccipital regions, more pronounced on the right side. No focus of abnormal contrast enhancement are Vascular: Normal flow voids. Skull and upper cervical spine: Normal marrow signal. Sinuses/Orbits: Bilateral lens surgery. Trace mucosal thickening of the ethmoid cells and frontal sinuses. Other: A 12 mm left parotid nodule. NeuroQuant  Findings: Volumetric analysis of the brain was performed, with a fully detailed report in [facility] Briefly, the comparison with age and gender matched reference reveals marked decreased volume of the cortical gray matter. IMPRESSION: 1. Marked cortical atrophy in the bilateral parietooccipital regions, more pronounced on the right side. 2. Few punctate foci of T2 hyperintensity within the white matter of the cerebral hemispheres, nonspecific. 3. NeuroQuant volumetric analysis of the brain, see details on [employer]. Electronically Signed   By: Pedro Earls M.D.   On: 03/03/2020 14:09     Assessment/Plan Posterior Cortical Atrophy  Joshua Lamb presents with clinical and radiographic syndrome consistent with posterior cortical atrophy.  This is also taking into account neuropsych screening testing done at prior provider's visit, with patient's [place] drawings kindly uploaded to EMR.    We extensively reviewed the clinical implications of this diagnosis, which is most commonly associated with Alzheimer's histopathalogic hallmarks.  There are no parkinsonian or frontotemporal features that would suggest Lewy Body or other less common syndrome.   We recommended referral and consultation with [facility] given rarity of this syndrome and dearth of apparent available clinical trials, per our review.  Patient is agreeable, referral will be placed and faxed over.  Will not plan to start acetylcholinesterase inhibitor or other cognitive agents at this time.      We appreciate the opportunity to participate in the care of [place] for potential clinical trials was performed and discussed using eligibility criteria for active protocols at [facility], loco-regional tertiary centers, as well as national database available on [email or web address].    All questions were answered. The patient knows to call the clinic with any problems, questions or concerns.  No barriers to learning were detected.  The total time spent in the encounter was 45 minutes and more than 50% was on counseling and review of test results   Ventura Sellers, MD Medical Director of Neuro-Oncology [facility] [place] 03/09/20 3:31 PM

## 2020-03-10 NOTE — Progress Notes (Signed)
   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 Brigitte Pulse.  Dr. 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  Chiefconcernaccording to patient : " I can't play the same board games, having trouble keeping up" , he has had increasing trouble with visual spatial interpretation.  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  Dr. Dwyane Dee follows his DM, after he lost 60 pounds ( reportedly) on metformin and jardiance). He has a history of central apnea. Dr. Kathrin Penner found no abnormalities in eye examination with visual fields.-he is enrolled in a brain health project. He has changed cognitively over the last 3 years, progressively.   Epworth sleepiness score: N/A  BMI: 21.0 kg/m  FINDINGS:   Total Record Time (hours, min): 8 h 51 min  Total Sleep Time (hours, min):  7 h 54 min   Percent REM (%):    29.0 %   Calculated pAHI (per hour):  8.6       REM pAHI:    7.5     NREM pAHI: 9.0 Supine AHI: 29.0   Oxygen Saturation (%) Mean: 95  Minimum oxygen saturation (%):         86   O2 Saturation Range (%): 86-98  O2Saturation (minutes) <=88%: 0.3 min   Pulse Mean (bpm):    49  Pulse Range (38-78)   IMPRESSION: 1)Very mild OSA (obstructive sleep apnea) with an AHI of 8.6/h and 2) Moderate Snoring.  3) trend to  Bradycardia. 4) apnea had no REM sleep accentuation but was clearly dependent on supine sleep position.     RECOMMENDATION: 1) Avoiding the supine sleep position would be the easiest solution. The patient presented with a strongly supine dependent apnea form. 2) if avoiding the supine sleep position can not be done, I would recommend therapy with a dental device over a CPAP device.     INTERPRETING PHYSICIAN:  Larey Seat, MD  [facility] Sleep Board certified by The [employer] of Sleep Medicine and Diplomate of the [employer] of Sleep Medicine. Board certified In Neurology through the [place], Fellow of the [employer] of Neurology. Medical Director of [employer].

## 2020-03-16 ENCOUNTER — Other Ambulatory Visit: Payer: Self-pay | Admitting: *Deleted

## 2020-03-16 ENCOUNTER — Other Ambulatory Visit: Payer: Self-pay | Admitting: Endocrinology

## 2020-03-16 DIAGNOSIS — R4189 Other symptoms and signs involving cognitive functions and awareness: Secondary | ICD-10-CM | POA: Insufficient documentation

## 2020-03-16 DIAGNOSIS — R634 Abnormal weight loss: Secondary | ICD-10-CM | POA: Insufficient documentation

## 2020-03-16 DIAGNOSIS — G4731 Primary central sleep apnea: Secondary | ICD-10-CM | POA: Insufficient documentation

## 2020-03-16 DIAGNOSIS — G4733 Obstructive sleep apnea (adult) (pediatric): Secondary | ICD-10-CM | POA: Insufficient documentation

## 2020-03-16 DIAGNOSIS — R41842 Visuospatial deficit: Secondary | ICD-10-CM | POA: Insufficient documentation

## 2020-03-16 MED ORDER — PIOGLITAZONE HCL 30 MG PO TABS
30.0000 mg | ORAL_TABLET | Freq: Every day | ORAL | 1 refills | Status: DC
Start: 2020-03-16 — End: 2020-03-16

## 2020-03-16 MED FILL — FREESTYLE LANCETS: 90 days supply | Qty: 100 | Fill #0

## 2020-03-16 NOTE — Procedures (Signed)
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 Brigitte Pulse.  Dr. 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  Chiefconcernaccording to patient : " I can't play the same board games, having trouble keeping up" , he has had  trouble with visual spatial interpretation.  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  Dr. Dwyane Dee follows his DM, after he lost 60 pounds ( reportedly) on metformin and jardiance). He has a history of central apnea. Dr. Kathrin Penner found no abnormalities in eye examination with visual fields.-he is enrolled in a brain health project. He has changed cognitively over the last 3 years, progressively.   Epworth sleepiness score: N/A  BMI: 21.0 kg/m  FINDINGS:   Total Record Time (hours, min): 8 h 51 min  Total Sleep Time (hours, min):  7 h 54 min   Percent REM (%):    29.0 %   Calculated pAHI (per hour):  8.6       REM pAHI:    7.5     NREM pAHI: 9.0 Supine AHI: 29.0   Oxygen Saturation (%) Mean: 95  Minimum oxygen saturation (%):         86   O2 Saturation Range (%): 86-98  O2Saturation (minutes) <=88%: 0.3 min   Pulse Mean (bpm):    49  Pulse Range (38-78)   IMPRESSION: 1)Very mild OSA (obstructive sleep apnea) with an AHI of 8.6/h and 2) Moderate Snoring.  3) trend to Bradycardia.  4) apnea had no REM sleep accentuation but was clearly dependent on supine sleep position.     RECOMMENDATION: 1) Avoiding the supine sleep position would be the easiest solution. The patient presented with a strongly supine dependent apnea form. 2) if avoiding the supine sleep position can not be done, I would recommend therapy with a dental device over a CPAP device.     INTERPRETING PHYSICIAN:  Larey Seat, MD  [facility] Sleep Board certified by The [employer] of Sleep Medicine and Diplomate of the [employer] of Sleep Medicine. Board certified In Neurology through the [place], Fellow of the [employer] of Neurology. Medical Director of [employer].

## 2020-03-16 NOTE — Progress Notes (Signed)
IMPRESSION: 1)Very mild OSA (obstructive sleep apnea) with an AHI of 8.6/h and 2) Moderate Snoring.  3) trend to Bradycardia. 4) apnea had no REM sleep accentuation but was clearly dependent on supine sleep position.     RECOMMENDATION: 1) Avoiding the supine sleep position would be the easiest solution. The patient presented with a strongly supine dependent apnea form. 2) if avoiding the supine sleep position can not be done, I would recommend therapy with a dental device over a CPAP device.

## 2020-03-17 ENCOUNTER — Telehealth: Payer: Self-pay | Admitting: Neurology

## 2020-03-17 NOTE — Telephone Encounter (Signed)
Called and spoke with the patient. Reviewed the SS results with him in detail. Advised the patient overall if he can avoid sleeping on his back this would be enough to start treatment as it will at least be in very mild range. Advised that if he continues to have issues with sleepiness or other concerns we can always assess setting up with CPAP. At this time patient agrees with plan and will work on ways to avoid sleeping on back.  Pt verbalized understanding.

## 2020-03-17 NOTE — Telephone Encounter (Signed)
-----   Message from Larey Seat, MD sent at 03/16/2020  5:03 PM EST ----- IMPRESSION: 1)Very mild OSA (obstructive sleep apnea) with an AHI of 8.6/h and 2) Moderate Snoring.  3) trend to Bradycardia. 4) apnea had no REM sleep accentuation but was clearly dependent on supine sleep position.     RECOMMENDATION: 1) Avoiding the supine sleep position would be the easiest solution. The patient presented with a strongly supine dependent apnea form. 2) if avoiding the supine sleep position can not be done, I would recommend therapy with a dental device over a CPAP device.

## 2020-03-21 ENCOUNTER — Encounter (HOSPITAL_BASED_OUTPATIENT_CLINIC_OR_DEPARTMENT_OTHER): Payer: 59 | Admitting: Internal Medicine

## 2020-03-22 DIAGNOSIS — G4730 Sleep apnea, unspecified: Secondary | ICD-10-CM | POA: Diagnosis not present

## 2020-03-22 DIAGNOSIS — G629 Polyneuropathy, unspecified: Secondary | ICD-10-CM | POA: Diagnosis not present

## 2020-03-22 DIAGNOSIS — E785 Hyperlipidemia, unspecified: Secondary | ICD-10-CM | POA: Diagnosis not present

## 2020-03-22 DIAGNOSIS — E1149 Type 2 diabetes mellitus with other diabetic neurological complication: Secondary | ICD-10-CM | POA: Diagnosis not present

## 2020-03-22 DIAGNOSIS — Z1331 Encounter for screening for depression: Secondary | ICD-10-CM | POA: Diagnosis not present

## 2020-03-22 DIAGNOSIS — G319 Degenerative disease of nervous system, unspecified: Secondary | ICD-10-CM | POA: Diagnosis not present

## 2020-03-22 MED FILL — JARDIANCE 25 MG TABLET: 25 | 90 days supply | Qty: 90 | Fill #6

## 2020-03-23 MED FILL — PIOGLITAZONE HCL 30 MG TABS: 30 | 90 days supply | Qty: 90 | Fill #0

## 2020-04-07 ENCOUNTER — Other Ambulatory Visit: Payer: Self-pay | Admitting: *Deleted

## 2020-04-07 ENCOUNTER — Other Ambulatory Visit: Payer: Self-pay | Admitting: Endocrinology

## 2020-04-07 MED ORDER — METFORMIN HCL 1000 MG PO TABS
1000.0000 mg | ORAL_TABLET | Freq: Two times a day (BID) | ORAL | 1 refills | Status: DC
Start: 2020-04-07 — End: 2020-04-07

## 2020-04-07 MED FILL — METFORMIN HCL 1000 MG TABS: 1000 | 90 days supply | Qty: 180 | Fill #0

## 2020-04-13 ENCOUNTER — Other Ambulatory Visit (HOSPITAL_COMMUNITY): Payer: Self-pay | Admitting: Internal Medicine

## 2020-04-16 ENCOUNTER — Other Ambulatory Visit: Payer: Self-pay | Admitting: Internal Medicine

## 2020-04-16 DIAGNOSIS — G319 Degenerative disease of nervous system, unspecified: Secondary | ICD-10-CM

## 2020-04-20 DIAGNOSIS — L821 Other seborrheic keratosis: Secondary | ICD-10-CM | POA: Diagnosis not present

## 2020-04-20 DIAGNOSIS — L820 Inflamed seborrheic keratosis: Secondary | ICD-10-CM | POA: Diagnosis not present

## 2020-04-21 ENCOUNTER — Other Ambulatory Visit (INDEPENDENT_AMBULATORY_CARE_PROVIDER_SITE_OTHER): Payer: 59

## 2020-04-21 ENCOUNTER — Other Ambulatory Visit: Payer: Self-pay

## 2020-04-21 DIAGNOSIS — E1165 Type 2 diabetes mellitus with hyperglycemia: Secondary | ICD-10-CM

## 2020-04-21 LAB — BASIC METABOLIC PANEL
BUN: 20 mg/dL (ref 6–23)
CO2: 30 mEq/L (ref 19–32)
Calcium: 10 mg/dL (ref 8.4–10.5)
Chloride: 101 mEq/L (ref 96–112)
Creatinine, Ser: 0.89 mg/dL (ref 0.40–1.50)
GFR: 89.72 mL/min (ref >60.00)
Glucose, Bld: 125 mg/dL — ABNORMAL HIGH (ref 70–99)
Potassium: 4.1 mEq/L (ref 3.5–5.1)
Sodium: 138 mEq/L (ref 135–145)

## 2020-04-21 LAB — HEMOGLOBIN A1C: Hgb A1c MFr Bld: 7.1 % — ABNORMAL HIGH (ref 4.6–6.5)

## 2020-04-28 ENCOUNTER — Ambulatory Visit: Payer: 59 | Attending: Internal Medicine | Admitting: Speech Pathology

## 2020-04-28 ENCOUNTER — Encounter: Payer: Self-pay | Admitting: Speech Pathology

## 2020-04-28 ENCOUNTER — Encounter: Payer: Self-pay | Admitting: Occupational Therapy

## 2020-04-28 ENCOUNTER — Other Ambulatory Visit: Payer: Self-pay

## 2020-04-28 ENCOUNTER — Ambulatory Visit: Payer: 59 | Admitting: Occupational Therapy

## 2020-04-28 ENCOUNTER — Encounter: Payer: Self-pay | Admitting: Endocrinology

## 2020-04-28 ENCOUNTER — Ambulatory Visit: Payer: 59 | Admitting: Endocrinology

## 2020-04-28 VITALS — BP 106/62 | HR 75 | Ht 68.0 in | Wt 140.0 lb

## 2020-04-28 DIAGNOSIS — R41840 Attention and concentration deficit: Secondary | ICD-10-CM | POA: Insufficient documentation

## 2020-04-28 DIAGNOSIS — R41842 Visuospatial deficit: Secondary | ICD-10-CM | POA: Diagnosis not present

## 2020-04-28 DIAGNOSIS — R278 Other lack of coordination: Secondary | ICD-10-CM | POA: Insufficient documentation

## 2020-04-28 DIAGNOSIS — E119 Type 2 diabetes mellitus without complications: Secondary | ICD-10-CM

## 2020-04-28 DIAGNOSIS — R41841 Cognitive communication deficit: Secondary | ICD-10-CM | POA: Diagnosis not present

## 2020-04-28 NOTE — Patient Instructions (Signed)
  Use index card or book mark   Clutter is not your friend  Tree surgeon [place]ry games Murray saw puzzles Easy cross words Memory match Board games Dominoes Majong Learn a new game!  Listen to and discuss Ted Talks or Podcasts Read and discuss short articles of interest to you- Take notes on these if memory is a challenge Discuss social media posts Look and discuss photo albums  The best activities to improve cognition are functional, real life activities that are important to you:  Plan a menu Participate in household chores and decisions (with supervision) Participate in [place] a party, trip or tailgate with all of the details (even if you aren't really going to carry it out) Participate in your hobby as you are able with assistance Manage your texts, emails with supervision if needed. Google search for items (even if you're not really going to buy anything) and compare prices and features Socialize -  however, too many visitors can be overwhelming, so set limits "My doctor said I should only visit (or talk) for 20 minutes" or "I do better when I visit with just 1-2 people at a time for 20 minutes"    It's good to use real in-person games, not just apps  Apps:  [employer] Elevate There are apps for most of the games listed above

## 2020-04-28 NOTE — Patient Instructions (Signed)
Check blood sugars on waking up 3-4 days a week  Also check blood sugars about 2 hours after meals and do this after different meals by rotation  Recommended blood sugar levels on waking up are 90-130 and about 2 hours after meal is 130-160  Please bring your blood sugar monitor to each visit, thank you   

## 2020-04-28 NOTE — Therapy (Signed)
[facility] [address] [place], [place], [address] Phone: [phone]   Fax:  [phone]  Occupational Therapy Evaluation  Patient Details  Name: Joshua Lamb MRN: [id number] Date of Birth: 1956-01-08 Referring Provider (OT): Cecil Cobbs   Encounter Date: 04/28/2020   OT End of Session - 04/28/20 1351    Visit Number 1    Number of Visits 9    Date for OT Re-Evaluation 07/12/20    Authorization Type [employer] - [place]    OT Start Time 1233    OT Stop Time 1320    OT Time Calculation (min) 47 min    Activity Tolerance Patient tolerated treatment well    Behavior During Therapy [facility] for tasks assessed/performed           Past Medical History:  Diagnosis Date  . Arthritis   . Diabetes mellitus without complication ([place])   . GERD (gastroesophageal reflux disease)   . High cholesterol     Past Surgical History:  Procedure Laterality Date  . CLEFT LIP REPAIR    . CLEFT PALATE REPAIR      There were no vitals filed for this visit.   Subjective Assessment - 04/28/20 1244    Subjective  I am having trouble finding things in my house    Pertinent History DM, Hyperlipidemia    Currently in Pain? No/denies    Multiple Pain Sites No             [facility] OT Assessment - 04/28/20 1246      Assessment   Medical Diagnosis Posterior Cortical Atrophy    Referring Provider (OT) Cecil Cobbs    Onset Date/Surgical Date 04/16/20   referral date   Hand Dominance Right    Prior Therapy None      Balance Screen   Has the patient fallen in the past 6 months Yes    How many times? 1   fell over object he didn't see     Prior Function   Level of Independence Independent with basic ADLs    Vocation Part time employment    Youth worker    Leisure Reading, social, exercise, outside work      ADL   Eating/Feeding Independent    Grooming Independent    Scientist, clinical (histocompatibility and immunogenetics) Independent     Lower Body Bathing Independent    Upper Body Dressing Increased time   recognizing front back of shirts   Lower Body Dressing Increased time   front/back discrimination difficult   Patent examiner - Social research officer, government -  Product/process development scientist Independent    ADL comments Difficulty with organizing front and back of clothing      IADL   Prior Level of Function Sales promotion account executive independently for small purchases   visual organization   Prior Level of Function Light Housekeeping Independent    Light Housekeeping Performs light daily tasks such as dishwashing, bed making    Prior Level of Function Meal Prep Independent    Meal Prep Able to complete simple warm meal prep    Prior Level of Function Passenger transport manager own vehicle    Prior Level of Function Medication Managment Independent    Medication Management Is responsible for taking medication in correct dosages at correct time    Prior Level of Function Financial  Management Independent    Financial Management Manages financial matters independently (budgets, writes checks, pays rent, bills goes to bank), collects and keeps track of income      Written Expression   Dominant Hand Right    Handwriting 100% legible   letters trail downward off line     Vision - History   Baseline Vision Wears glasses only for reading    Visual History Cataracts   bilateral - removal     Vision Assessment   Eye Alignment Within Functional Limits    Ocular Range of Motion Within Functional Limits    Alignment/Gaze Preference Within Defined Limits    Tracking/Visual Pursuits Able to track stimulus in all quads without difficulty    Patient has diffculty with activities due to visual impairment --   organizing and locating items   Motor Free Visual Perception Test-Vertical Initiated - difficulty with figure ground,  need further assessment      Activity Tolerance   Activity Tolerance Comments Patient exercises routinely, works part time as psychiatrist      Cognition   Overall Cognitive Status Impaired/Different from baseline    Area of Impairment Attention;Memory;Orientation    Orientation Level --   spatial   General Comments difficulty with spatial orientation    Current Attention Level Alternating    Attention Comments describes difficulty with susutaining and alternating attention    Memory Decreased short-term memory    Cognition Comments Patient is aware and frustrated with cognitive and visual perceptual deficits.      Observation/Other Assessments   Focus on Therapeutic Outcomes (FOTO)  NA      Posture/Postural Control   Posture/Postural Control No significant limitations      Sensation   Light Touch Appears Intact      Coordination   Gross Motor Movements are Fluid and Coordinated Yes    Fine Motor Movements are Fluid and Coordinated No    Coordination and Movement Description mild dysmetria, undershooting    Finger Nose Finger Test difficulty following visual demonstration    Coordination Very mild impairment      Perception   Perception Impaired    Spatial Orientation Difficulty locating items in home, e.g. water bottle      Praxis   Praxis Impaired    Praxis Impairment Details Motor planning      ROM / Strength   AROM / PROM / Strength AROM      AROM   Overall AROM  Within functional limits for tasks performed                           OT Education - 04/28/20 1351    Education Details Reviewed eval results and potential plan of care / goals for OT    Person(s) Educated Patient    Methods Explanation    Comprehension Verbalized understanding            OT Short Term Goals - 04/28/20 1500      OT SHORT TERM GOAL #1   Title Patient will demonstrate awareness of home activities program to address visual organizational skills with min cueing     Time 4    Period Weeks    Status New      OT SHORT TERM GOAL #2   Title Patient will demonstrate tabletop visual scanning in busy distracting environment with 75% accuracy.    Time 4    Period Weeks    Status New  OT Long Term Goals - 04/28/20 1505      OT LONG TERM GOAL #1   Title Patient will self report improvement in locating common items used daily, e.g. water bottle, glasses    Time 8    Period Weeks    Status New      OT LONG TERM GOAL #2   Title Patient will self report improvement in typing skills, or will investigate talk to text technology    Time 8    Period Weeks    Status New      OT LONG TERM GOAL #3   Title Patient will demonstrate error recognition when transcribing letters incorrectly into word games/puzzles    Time 8    Period Weeks    Status New                 Plan - 04/28/20 1353    Clinical Impression Statement Patient is a 65 yr old male, still practicing part time as a psychiatrist.  Patient has recently been diagnosed with Posterior Cortical Atrophy.  Patient reports difficulty with dressing - identifying front and back of clothing, difficulty with visual organzation - losing objects in his house.  Patient reports memory difficulties and is challenged with typing, and wife reports change in handwriting.  Patient will benefit from skilled OT intervention to address compensatory strategies as well as remediation strategies to help maintain highest level of functioning as long as possible.    OT Occupational Profile and History Detailed Assessment- Review of Records and additional review of physical, cognitive, psychosocial history related to current functional performance    Occupational performance deficits (Please refer to evaluation for details): ADL's;IADL's;Work    Body Structure / Function / Physical Skills ADL;Coordination;Sensation;IADL;Dexterity;FMC;Vision    Cognitive Skills Attention;Orientation;Memory    Rehab  Potential Good    Clinical Decision Making Limited treatment options, no task modification necessary    Comorbidities Affecting Occupational Performance: None    Modification or Assistance to Complete Evaluation  No modification of tasks or assist necessary to complete eval    OT Frequency 1x / week    OT Duration 8 weeks    OT Treatment/Interventions Self-care/ADL training;Therapeutic exercise;Visual/perceptual remediation/compensation;Coping strategies training;Patient/family education;Neuromuscular education;Therapeutic activities;Cognitive remediation/compensation    Plan Complete MVPT - Stopped at #11 - figure ground section, assess reading, ability to transcribe    Consulted and Agree with Plan of Care Patient           Patient will benefit from skilled therapeutic intervention in order to improve the following deficits and impairments:   Body Structure / Function / Physical Skills: ADL,Coordination,Sensation,IADL,Dexterity,FMC,Vision Cognitive Skills: Attention,Orientation,Memory     Visit Diagnosis: Visuospatial deficit - Plan: Ot plan of care cert/re-cert  Attention and concentration deficit - Plan: Ot plan of care cert/re-cert  Other lack of coordination - Plan: Ot plan of care cert/re-cert    Problem List Patient Active Problem List   Diagnosis Date Noted  . Unintended weight loss 03/16/2020  . Central sleep apnea 03/16/2020  . Cognitive deficit with impaired visuospatial function 03/16/2020  . Mild obstructive sleep apnea-hypopnea syndrome 03/16/2020  . Posterior cortical atrophy ([place]) 03/09/2020  . Uncontrolled type 2 diabetes mellitus with hyperglycemia ([place]) 02/02/2020  . Hypercholesterolemia 02/02/2020    Mariah Milling, OTR/L 04/28/2020, 3:12 PM  [facility] [address] [place], [place], [address] Phone: [phone]   Fax:  [phone]  Name: Joshua Lamb MRN: [id number] Date of  Birth:  04/24/1955 

## 2020-04-28 NOTE — Progress Notes (Signed)
Patient ID: Joshua Lamb, male   DOB: [birth year], 65 y.o.   MRN: [id number]           Reason for Appointment: Follow-up for Type 2 Diabetes  Referring PCP: Martha Clan   History of Present Illness:          Date of diagnosis of type 2 diabetes mellitus:   2017    Background history:  He was diagnosed when he had blood sugars above normal without any symptoms He believes his highest A1c was 7.8 Initially was treated with Metformin and apparently because of inadequate level of control he was given 25 mg Jardiance also probably in 2017 Apparently his weight prior to starting Jardiance was 190 pounds He progressively lost weight with this and his A1c has been between 6.3 and 7.3 since 2020  Recent history:   Most recent A1c is 7.1 Fructosamine last 280  Non-insulin hypoglycemic drugs the patient is taking are: Metformin 1 g twice daily, Jardiance 12.5 mg daily, Actos 30 mg daily  Current management, blood sugar patterns and problems identified:  He is still using his old prescription of Jardiance and taking 12.5 mg daily  Although his weight is up only 1 pound he thinks it is increasing significantly since last summer  He feels fairly good overall with no fatigue  Blood sugars have been checked very erratically and mostly late evening  He thinks that occasional high readings are from not watching his diet over the holidays  Lab glucose late morning was 125  However he has had only 2 readings in the mornings at home, once 167  His weight is up 1 pound  No edema with Actos  Evening meal is relatively lighter; sometimes will have 2 sandwiches at lunch  He tries to use his exercise bike very regularly        Side effects from medications have been: None     Meal times are:  Breakfast is at 7 AM, dinner 6:30 PM  Typical meal intake: Breakfast is   toast with eggs and sausage or oatmeal with 4 ounces of juice.  Dinner usually salad, vegetables and protein.  Snacks  apple or peanuts     Only occasionally will go to fast food restaurants          Glucose monitoring:  done <1 times a day         Glucometer:  Freestyle lite     Blood Glucose readings by review of meter  Evening range 107-204, morning 136, 167 AVERAGE 141  Previously  PRE-MEAL Fasting Lunch Dinner Bedtime Overall  Glucose range:  112-131      Mean/median: 122     131   POST-MEAL PC Breakfast PC Lunch PC Dinner  Glucose range:  161    97-221  Mean/median:    134    Dietician visit, most recent: 2017  Weight history:  Wt Readings from Last 3 Encounters:  04/28/20 140 lb (63.5 kg)  03/08/20 142 lb 3.2 oz (64.5 kg)  03/03/20 139 lb 6.4 oz (63.2 kg)    Glycemic control:   Lab Results  Component Value Date   HGBA1C 7.1 (H) 04/21/2020   HGBA1C 7.4 (A) 02/02/2020   HGBA1C 7.2 08/07/2019   Lab Results  Component Value Date   MICROALBUR 24 08/14/2019   LDLCALC 70 08/07/2019   CREATININE 0.89 04/21/2020   No results found for: [facility]  Lab Results  Component Value Date   FRUCTOSAMINE 280 02/25/2020  No visits with results within 1 Week(s) from this visit.  Latest known visit with results is:  Lab on 04/21/2020  Component Date Value Ref Range Status  . Sodium 04/21/2020 138  135 - 145 mEq/L Final  . Potassium 04/21/2020 4.1  3.5 - 5.1 mEq/L Final  . Chloride 04/21/2020 101  96 - 112 mEq/L Final  . CO2 04/21/2020 30  19 - 32 mEq/L Final  . Glucose, Bld 04/21/2020 125* 70 - 99 mg/dL Final  . BUN 04/21/2020 20  6 - 23 mg/dL Final  . Creatinine, Ser 04/21/2020 0.89  0.40 - 1.50 mg/dL Final  . GFR 04/21/2020 89.72  >60.00 mL/min Final   Calculated using the CKD-EPI Creatinine Equation (2021)  . Calcium 04/21/2020 10.0  8.4 - 10.5 mg/dL Final  . Hgb A1c MFr Bld 04/21/2020 7.1* 4.6 - 6.5 % Final   Glycemic Control Guidelines for People with Diabetes:Non Diabetic:  <6%Goal of Therapy: <7%Additional Action Suggested:  >8%     Allergies as of 04/28/2020   No  Known Allergies     Medication List       Accurate as of April 28, 2020  9:18 PM. If you have any questions, ask your nurse or doctor.        acetaminophen 500 MG tablet Commonly known as: TYLENOL Take 1,000 mg by mouth every 6 (six) hours as needed for moderate pain.   aspirin EC 81 MG tablet Take 81 mg by mouth at bedtime.   empagliflozin 10 MG Tabs tablet Commonly known as: Jardiance Take 1 tablet (10 mg total) by mouth daily with breakfast. What changed:   how much to take  additional instructions   freestyle lancets USE TO CHECK BLOOD SUGAR ONCE DAILY   FREESTYLE LITE test strip Generic drug: glucose blood 1 each by Other route as needed for other. Use as instructed to check blood sugar once a day dx code E11.65   metFORMIN 1000 MG tablet Commonly known as: GLUCOPHAGE Take 1 tablet (1,000 mg total) by mouth 2 (two) times daily with a meal.   pantoprazole 40 MG tablet Commonly known as: PROTONIX Take 40 mg by mouth daily as needed (for acid reflux).   pioglitazone 30 MG tablet Commonly known as: Actos Take 1 tablet (30 mg total) by mouth daily.   rosuvastatin 20 MG tablet Commonly known as: CRESTOR Take 20 mg by mouth at bedtime.       Allergies: No Known Allergies  Past Medical History:  Diagnosis Date  . Arthritis   . Diabetes mellitus without complication ([place])   . GERD (gastroesophageal reflux disease)   . High cholesterol     Past Surgical History:  Procedure Laterality Date  . CLEFT LIP REPAIR    . CLEFT PALATE REPAIR      Family History  Problem Relation Age of Onset  . Diabetes Father   . Heart disease Neg Hx     Social History:  reports that he has never smoked. He has never used smokeless tobacco. He reports current alcohol use. He reports that he does not use drugs.   Review of Systems   Lipid history: Lipids controlled with 20 mg Crestor    Lab Results  Component Value Date   LDLCALC 70 08/07/2019   TRIG 62  08/07/2019           Blood pressure history:  BP Readings from Last 3 Encounters:  04/28/20 106/62  03/08/20 106/64  03/03/20 108/62    Most  recent eye exam was in 9/21  Most recent foot exam: 11/21  Currently known complications of diabetes: None  LABS:  No visits with results within 1 Week(s) from this visit.  Latest known visit with results is:  Lab on 04/21/2020  Component Date Value Ref Range Status  . Sodium 04/21/2020 138  135 - 145 mEq/L Final  . Potassium 04/21/2020 4.1  3.5 - 5.1 mEq/L Final  . Chloride 04/21/2020 101  96 - 112 mEq/L Final  . CO2 04/21/2020 30  19 - 32 mEq/L Final  . Glucose, Bld 04/21/2020 125* 70 - 99 mg/dL Final  . BUN 04/21/2020 20  6 - 23 mg/dL Final  . Creatinine, Ser 04/21/2020 0.89  0.40 - 1.50 mg/dL Final  . GFR 04/21/2020 89.72  >60.00 mL/min Final   Calculated using the CKD-EPI Creatinine Equation (2021)  . Calcium 04/21/2020 10.0  8.4 - 10.5 mg/dL Final  . Hgb A1c MFr Bld 04/21/2020 7.1* 4.6 - 6.5 % Final   Glycemic Control Guidelines for People with Diabetes:Non Diabetic:  <6%Goal of Therapy: <7%Additional Action Suggested:  >8%     Physical Examination:  BP 106/62   Pulse 75   Ht 5\' 8"  (1.727 m)   Wt 140 lb (63.5 kg)   SpO2 94%   BMI 21.29 kg/m   No pedal edema present    ASSESSMENT:  Diabetes type 2 non-insulin-dependent  See history of present illness for detailed discussion of current diabetes management, blood sugar patterns and problems identified  A1c 7.1  He is on Metformin 1 g twice daily and Jardiance 12.5 mg as well as Actos 30 mg As above his blood sugars are generally well controlled although may be higher more recently from celebrations and not watching his diet consistently However has only 1 reading over 200 His weight has improved with reducing Jardiance and adding Actos   PLAN:    When he finishes his 25 mg Jardiance tablets he will switch to 10 mg daily and continue Actos 30 mg To check blood  sugar by rotation at different times Discussed blood sugar targets at various times And will try to be consistent with exercise also   Patient Instructions  Check blood sugars on waking up 3-4 days a week  Also check blood sugars about 2 hours after meals and do this after different meals by rotation  Recommended blood sugar levels on waking up are 90-130 and about 2 hours after meal is 130-160  Please bring your blood sugar monitor to each visit, thank you       Elayne Snare 04/28/2020, 9:18 PM   Note: This office note was prepared with Dragon voice recognition system technology. Any transcriptional errors that result from this process are unintentional.

## 2020-04-28 NOTE — Therapy (Signed)
[facility] [address] [place], [place], [address] Phone: [phone]   Fax:  [phone]  Speech Language Pathology Evaluation  Patient Details  Name: Joshua Lamb MRN: [id number] Date of Birth: [birth year] Referring Provider (SLP): Cecil Cobbs   Encounter Date: 04/28/2020   End of Session - 04/28/20 1154    Visit Number 1    Number of Visits 17    Date for SLP Re-Evaluation 06/23/20    Authorization Type Auth required after 25th visit    SLP Start Time 1103    SLP Stop Time  1150    SLP Time Calculation (min) 47 min    Activity Tolerance Patient tolerated treatment well           Past Medical History:  Diagnosis Date  . Arthritis   . Diabetes mellitus without complication ([place])   . GERD (gastroesophageal reflux disease)   . High cholesterol     Past Surgical History:  Procedure Laterality Date  . CLEFT LIP REPAIR    . CLEFT PALATE REPAIR      There were no vitals filed for this visit.   Subjective Assessment - 04/28/20 1504    Subjective "I am constantly leaving things places." "I am an avid boardgame player and now I am just a placeholder in my family."    Currently in Pain? No/denies              SLP Evaluation [facility] - 04/28/20 1504      SLP Visit Information   SLP Received On 04/28/20    Referring Provider (SLP) Cecil Cobbs    Onset Date 2021 - dx; sx 3-4 years    Medical Diagnosis PCA      General Information   HPI The patient's records from the referring physician were obtained and reviewed and the patient interviewed to confirm this HPI.  Joshua Lamb presents today with his wife to review recent neurologic complaints.  They essentially describe a 3-4 years progressive history of impairment in visual and spatial processing.  This is most notable when playing games requiring this skillset such as banana-grams or crossword puzzles.  It does impair his driving somewhat  as well, as he requires a co-passenger and only drives very short, familiar distances currently. Diagnosed with PCA recently    Mobility Status walks independenly      Balance Screen   Has the patient fallen in the past 6 months Yes    How many times? 1    Has the patient had a decrease in activity level because of a fear of falling?  No    Is the patient reluctant to leave their home because of a fear of falling?  No      Prior Functional Status   Cognitive/Linguistic Baseline Within functional limits    Type of Home House     Lives With Spouse    Vocation Part time employment      Cognition   Overall Cognitive Status Impaired/Different from baseline    Area of Impairment Attention;Memory;Orientation    Current Attention Level Selective    Memory Decreased short-term memory    Attention Selective;Alternating    Memory Impaired    Memory Impairment Storage deficit;Retrieval deficit;Decreased recall of new information;Decreased short term memory;Prospective memory    Awareness Impaired    Awareness Impairment Intellectual impairment    Problem Solving Impaired    Problem Solving Impairment Verbal basic;Functional basic    Executive Function Organizing;Sequencing;Reasoning;Decision  Making;Self Monitoring;Self Correcting      Auditory Comprehension   Overall Auditory Comprehension Appears within functional limits for tasks assessed    Interfering Components Attention;Motor planning;Processing speed;Working memory      Reading Comprehension   Reading Status Not tested      Expression   Primary Mode of Expression Verbal      Verbal Expression   Overall Verbal Expression Appears within functional limits for tasks assessed      Oral Motor/Sensory Function   Overall Oral Motor/Sensory Function Appears within functional limits for tasks assessed      Motor Speech   Overall Motor Speech Appears within functional limits for tasks assessed      Standardized Assessments    Standardized Assessments  Cognitive Linguistic Quick Test      Cognitive Linguistic Quick Test (Ages 18-69)   Attention Moderate    Memory Moderate    Executive Function Severe    Language WNL    Visuospatial Skills Severe    Severity Rating Total 10    Composite Severity Rating 9.2                           SLP Education - 04/28/20 1110    Education Details Provided education on the purpose of speech therapy and why he has been referred for evaluation.    Person(s) Educated Patient    Methods Explanation    Comprehension Verbalized understanding            SLP Short Term Goals - 04/28/20 1203      SLP SHORT TERM GOAL #1   Title Pt will use external aids to manage medications with no missed meds or double doses over 2 weeks.    Time 4    Period Weeks    Status New      SLP SHORT TERM GOAL #2   Title Pt will reports loosing less than 6 items over a week with occasional min A from family    Time 4    Period Weeks    Status New      SLP SHORT TERM GOAL #3   Title Pt will navigate remote  with external aids to select 2 streaming platforms with rare min A from family.    Time 4    Period Weeks    Status New            SLP Long Term Goals - 04/28/20 1214      SLP LONG TERM GOAL #1   Title Pt will use memory system to manage appointments, daily to do list/schedule with occassional min A from family    Time 8    Period Weeks    Status New      SLP LONG TERM GOAL #2   Title Pt will carryover 3 compensatory strategies to recall information from his family and doctors with occassional min A    Time 8    Period Weeks    Status New      SLP LONG TERM GOAL #3   Title Pt will ID/correct errors on cognitive linguistic tasks with occasional min A over 3 sessions    Time 8    Period Weeks    Status New            Plan - 04/28/20 1139    Clinical Impression Statement Pt was seen for ST evaluation on 04/28/20 with a diagnosis of PCA. Pt reports  difficulty  with word finding, picking items up and losing items around the house 2/2 visual impairment. Pt demonstrated relative strengths in auditory comprehension and expressive language. Pt was given CLQT during session. Pt presents with moderate cognitive linguistic impairments. Pt demonstrated difficulty with visual memory and executive functioning skills (i.e. attention, organization). Pt would benefit from skilled speech therapy services to address cognitive-communication deficit. Therapy services will include restorative and compensatory therapies.    Speech Therapy Frequency 2x / week    Duration 8 weeks   or 17 visits   Treatment/Interventions Language facilitation;Environmental controls;Cueing hierarchy;SLP instruction and feedback;Compensatory techniques;Cognitive reorganization;Functional tasks;Compensatory strategies;Internal/external aids;Multimodal communcation approach;Patient/family education    Potential to Achieve Goals Good    Potential Considerations Ability to learn/carryover information;Medical prognosis    SLP Home Exercise Plan To be discussed next session.    Consulted and Agree with Plan of Care Patient           Patient will benefit from skilled therapeutic intervention in order to improve the following deficits and impairments:   Cognitive communication deficit    Problem List Patient Active Problem List   Diagnosis Date Noted  . Unintended weight loss 03/16/2020  . Central sleep apnea 03/16/2020  . Cognitive deficit with impaired visuospatial function 03/16/2020  . Mild obstructive sleep apnea-hypopnea syndrome 03/16/2020  . 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 Uncontrolled type 2 diabetes mellitus with hyperglycemia ([place]) 02/02/2020  . Hypercholesterolemia 02/02/2020    Joshua Lamb, Annye Rusk MS, CCC-SLP 04/28/2020, 3:30 PM  [facility] [address] [place], [place], [address] Phone:  [phone]   Fax:  [phone]  Name: Joshua Lamb MRN: [id number] Date of Birth: 1955/06/08

## 2020-05-11 ENCOUNTER — Encounter: Payer: 59 | Admitting: Occupational Therapy

## 2020-05-18 ENCOUNTER — Encounter: Payer: Self-pay | Admitting: Occupational Therapy

## 2020-05-18 ENCOUNTER — Other Ambulatory Visit: Payer: Self-pay

## 2020-05-18 ENCOUNTER — Ambulatory Visit: Payer: 59 | Attending: Internal Medicine | Admitting: Occupational Therapy

## 2020-05-18 DIAGNOSIS — R41842 Visuospatial deficit: Secondary | ICD-10-CM | POA: Insufficient documentation

## 2020-05-18 DIAGNOSIS — R41841 Cognitive communication deficit: Secondary | ICD-10-CM | POA: Diagnosis not present

## 2020-05-18 DIAGNOSIS — R41840 Attention and concentration deficit: Secondary | ICD-10-CM | POA: Insufficient documentation

## 2020-05-18 DIAGNOSIS — R278 Other lack of coordination: Secondary | ICD-10-CM | POA: Diagnosis not present

## 2020-05-18 NOTE — Therapy (Signed)
[facility] [address] [place], [place], [address] Phone: [phone]   Fax:  [phone]  Occupational Therapy Treatment  Patient Details  Name: Joshua Lamb MRN: [id number] Date of Birth: September 03, 1955 Referring Provider (OT): Cecil Cobbs   Encounter Date: 05/18/2020   OT End of Session - 05/18/20 1316    Visit Number 2    Number of Visits 9    Date for OT Re-Evaluation 07/12/20    Authorization Type U[employer] - [place]    OT Start Time 1316    OT Stop Time 1400    OT Time Calculation (min) 44 min    Activity Tolerance Patient tolerated treatment well    Behavior During Therapy [facility] for tasks assessed/performed           Past Medical History:  Diagnosis Date  . Arthritis   . Diabetes mellitus without complication ([place])   . GERD (gastroesophageal reflux disease)   . High cholesterol     Past Surgical History:  Procedure Laterality Date  . CLEFT LIP REPAIR    . CLEFT PALATE REPAIR      There were no vitals filed for this visit.   Subjective Assessment - 05/18/20 1316    Subjective  Pt denies any pain, says, "doing just fine" reports difficulty with pulling out one item in a mixture of things, in dark settings,    Pertinent History DM, Hyperlipidemia    Currently in Pain? No/denies                        OT Treatments/Exercises (OP) - 05/18/20 1407      Visual/Perceptual Exercises   Copy this Image PVC    PVC Pt with min difficulty with separating pieces by like groups. Pt with max difficulty with following pattern (fig 4) with possible left inattention or field cut noted.    Other Exercises MVPT-revised score 19/39 with 49% accuracy. Pt did best on Figure Ground section with most difficulty with visual discrimination and visual closure.                    OT Short Term Goals - 04/28/20 1500      OT SHORT TERM GOAL #1   Title Patient will demonstrate awareness of  home activities program to address visual organizational skills with min cueing    Time 4    Period Weeks    Status New      OT SHORT TERM GOAL #2   Title Patient will demonstrate tabletop visual scanning in busy distracting environment with 75% accuracy.    Time 4    Period Weeks    Status New             OT Long Term Goals - 04/28/20 1505      OT LONG TERM GOAL #1   Title Patient will self report improvement in locating common items used daily, e.g. water bottle, glasses    Time 8    Period Weeks    Status New      OT LONG TERM GOAL #2   Title Patient will self report improvement in typing skills, or will investigate talk to text technology    Time 8    Period Weeks    Status New      OT LONG TERM GOAL #3   Title Patient will demonstrate error recognition when transcribing letters incorrectly into word games/puzzles    Time  8    Period Weeks    Status New                  Patient will benefit from skilled therapeutic intervention in order to improve the following deficits and impairments:           Visit Diagnosis: Visuospatial deficit  Attention and concentration deficit  Other lack of coordination  Cognitive communication deficit    Problem List Patient Active Problem List   Diagnosis Date Noted  . Unintended weight loss 03/16/2020  . Central sleep apnea 03/16/2020  . Cognitive deficit with impaired visuospatial function 03/16/2020  . Mild obstructive sleep apnea-hypopnea syndrome 03/16/2020  . Posterior cortical atrophy ([place]) 03/09/2020  . Uncontrolled type 2 diabetes mellitus with hyperglycemia ([place]) 02/02/2020  . Hypercholesterolemia 02/02/2020    Zachery Conch 05/18/2020, Laurin Coder PM  [facility] [address] [place], [place], [address] Phone: [phone]   Fax:  [phone]  Name: Joshua Lamb MRN: [id number] Date of Birth: 08/09/1955

## 2020-05-25 ENCOUNTER — Other Ambulatory Visit: Payer: Self-pay

## 2020-05-25 ENCOUNTER — Ambulatory Visit: Payer: 59 | Admitting: Occupational Therapy

## 2020-05-25 ENCOUNTER — Encounter: Payer: Self-pay | Admitting: Occupational Therapy

## 2020-05-25 DIAGNOSIS — R278 Other lack of coordination: Secondary | ICD-10-CM

## 2020-05-25 DIAGNOSIS — R41841 Cognitive communication deficit: Secondary | ICD-10-CM | POA: Diagnosis not present

## 2020-05-25 DIAGNOSIS — R41842 Visuospatial deficit: Secondary | ICD-10-CM | POA: Diagnosis not present

## 2020-05-25 DIAGNOSIS — R41840 Attention and concentration deficit: Secondary | ICD-10-CM | POA: Diagnosis not present

## 2020-05-25 NOTE — Therapy (Signed)
[facility] [address] [place], [place], [address] Phone: [phone]   Fax:  [phone]  Occupational Therapy Treatment  Patient Details  Name: Joshua Lamb MRN: [id number] Date of Birth: [birth year] Referring Provider (OT): Cecil Cobbs   Encounter Date: 05/25/2020   OT End of Session - 05/25/20 1508    Visit Number 3    Number of Visits 9    Date for OT Re-Evaluation 07/12/20    Authorization Type UMR - [place]    OT Start Time 1315    OT Stop Time 1400    OT Time Calculation (min) 45 min    Activity Tolerance Patient tolerated treatment well    Behavior During Therapy [facility] for tasks assessed/performed           Past Medical History:  Diagnosis Date  . Arthritis   . Diabetes mellitus without complication ([place])   . GERD (gastroesophageal reflux disease)   . High cholesterol     Past Surgical History:  Procedure Laterality Date  . CLEFT LIP REPAIR    . CLEFT PALATE REPAIR      There were no vitals filed for this visit.   Subjective Assessment - 05/25/20 1322    Subjective  Both my wife and I felt that I had shown some improvements - in reading a passage spontaneously at a group, and in drawing a clock (as compared to in Neuro-s office)    Pertinent History DM, Hyperlipidemia    Currently in Pain? No/denies                        OT Treatments/Exercises (OP) - 05/25/20 0001      ADLs   Home Maintenance Patient has workshop in his garage that he cannot currently use - has a plan to downsize current tool inventory as he is reducing number of home repair/ car maintenance projects due to current impairments.  Discussed as weather gets warmer, using a system to organize necessary tools, and eliminate extraneous tools to help him better to visually locate needed supplies.    Writing Patient indicates that he has noticed that his writing, when complete worksheets at work, is  trending downward on the page.  Will need to investigate further.    ADL Comments Reviewed OT goals. Patient in agreement.      Cognitive Exercises   Other Cognitive Exercises 1 Worked on visual tracking from keypad to computer screen.  Patient needed cueing to identify and correct errors, move cursor, locate cursor, etc in unfamiliar task - jumble puzzle online.  Patient was able to ID key letters and unscramble with increased time and cueing for organization.    Other Cognitive Exercises 2 Educated patient to reminder function in phone to schedule a reminder for next visit.  Patient reports just starting to play with calendar function on his phone.  These may prove to be helpful tools to help patient stay organized.                  OT Education - 05/25/20 1508    Education Details Reviewed OT goals, and use of reminder function in phone    Person(s) Educated Patient    Methods Explanation    Comprehension Need further instruction;Verbal cues required            OT Short Term Goals - 05/25/20 1327      OT SHORT TERM GOAL #1   Title  Patient will demonstrate awareness of home activities program to address visual organizational skills with min cueing    Time 4    Period Weeks    Status On-going      OT SHORT TERM GOAL #2   Title Patient will demonstrate tabletop visual scanning in busy distracting environment with 75% accuracy.    Time 4    Period Weeks    Status On-going             OT Long Term Goals - 05/25/20 1331      OT LONG TERM GOAL #1   Title Patient will self report improvement in locating common items used daily, e.g. water bottle, glasses    Time 8    Period Weeks    Status On-going      OT LONG TERM GOAL #2   Title Patient will self report improvement in typing skills, or will investigate talk to text technology    Time 8    Period Weeks    Status On-going      OT LONG TERM GOAL #3   Title Patient will demonstrate error recognition when  transcribing letters incorrectly into word games/puzzles    Time 8    Period Weeks    Status On-going                 Plan - 05/25/20 1509    Clinical Impression Statement Patient is very aware of deficits, and very engaged in working to improve / preserve  functional abilities as long as possible.  Patient also assessing whether or not he can continue to complete home repairs/ car maintenance.    OT Occupational Profile and History Detailed Assessment- Review of Records and additional review of physical, cognitive, psychosocial history related to current functional performance    Occupational performance deficits (Please refer to evaluation for details): ADL's;IADL's;Work    Body Structure / Function / Physical Skills ADL;Coordination;Sensation;IADL;Dexterity;FMC;Vision    Cognitive Skills Attention;Orientation;Memory    Rehab Potential Good    Clinical Decision Making Limited treatment options, no task modification necessary    Comorbidities Affecting Occupational Performance: None    Modification or Assistance to Complete Evaluation  No modification of tasks or assist necessary to complete eval    OT Frequency 1x / week    OT Duration 8 weeks    OT Treatment/Interventions Self-care/ADL training;Therapeutic exercise;Visual/perceptual remediation/compensation;Coping strategies training;Patient/family education;Neuromuscular education;Therapeutic activities;Cognitive remediation/compensation    Plan patient should bring jumble puzzle, review vision test results, assess writing with lined paper and open paper    Consulted and Agree with Plan of Care Patient           Patient will benefit from skilled therapeutic intervention in order to improve the following deficits and impairments:   Body Structure / Function / Physical Skills: ADL,Coordination,Sensation,IADL,Dexterity,FMC,Vision Cognitive Skills: Attention,Orientation,Memory     Visit Diagnosis: Visuospatial  deficit  Attention and concentration deficit  Other lack of coordination    Problem List Patient Active Problem List   Diagnosis Date Noted  . Unintended weight loss 03/16/2020  . Central sleep apnea 03/16/2020  . Cognitive deficit with impaired visuospatial function 03/16/2020  . Mild obstructive sleep apnea-hypopnea syndrome 03/16/2020  . 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 Uncontrolled type 2 diabetes mellitus with hyperglycemia ([place]) 02/02/2020  . Hypercholesterolemia 02/02/2020    Mariah Milling, OTR/L 05/25/2020, 3:13 PM  [facility] [address] [place], [place], [address] Phone: [phone]   Fax:  [place]  Name: Joshua Lamb MRN: [id number] Date of Birth: Jul 25, 1955

## 2020-06-01 ENCOUNTER — Other Ambulatory Visit: Payer: Self-pay

## 2020-06-01 ENCOUNTER — Encounter: Payer: Self-pay | Admitting: Occupational Therapy

## 2020-06-01 ENCOUNTER — Ambulatory Visit: Payer: 59

## 2020-06-01 ENCOUNTER — Ambulatory Visit: Payer: 59 | Attending: Internal Medicine | Admitting: Occupational Therapy

## 2020-06-01 DIAGNOSIS — R41842 Visuospatial deficit: Secondary | ICD-10-CM | POA: Diagnosis not present

## 2020-06-01 DIAGNOSIS — R41841 Cognitive communication deficit: Secondary | ICD-10-CM

## 2020-06-01 DIAGNOSIS — R278 Other lack of coordination: Secondary | ICD-10-CM | POA: Insufficient documentation

## 2020-06-01 DIAGNOSIS — R41840 Attention and concentration deficit: Secondary | ICD-10-CM | POA: Diagnosis not present

## 2020-06-01 NOTE — Therapy (Signed)
[facility] [address] [place], [place], [address] Phone: [phone]   Fax:  [phone]  Speech Language Pathology Treatment  Patient Details  Name: Joshua Lamb MRN: [id number] Date of Birth: 1955/05/24 Referring Provider (SLP): Cecil Cobbs   Encounter Date: 06/01/2020   End of Session - 06/01/20 1716    Visit Number 2    Number of Visits 17    Date for SLP Re-Evaluation 06/23/20    Authorization Type Auth required after 25th visit    SLP Start Time 1405    SLP Stop Time  1448    SLP Time Calculation (min) 43 min    Activity Tolerance Patient tolerated treatment well           Past Medical History:  Diagnosis Date  . Arthritis   . Diabetes mellitus without complication ([place])   . GERD (gastroesophageal reflux disease)   . High cholesterol     Past Surgical History:  Procedure Laterality Date  . CLEFT LIP REPAIR    . CLEFT PALATE REPAIR      There were no vitals filed for this visit.          ADULT SLP TREATMENT - 06/01/20 1706      General Information   Behavior/Cognition Alert;Cooperative;Pleasant mood      Cognitive-Linquistic Treatment   Treatment focused on Cognition;Patient/family/caregiver education    Skilled Treatment (pt education/self care 22 minutes) Pt expressed desire to see results of his cognitive linguistic evaluation. SLP reviewed problem subtests with pt and reinforced that some areas that were highlighted as difficult were executive function -planning/organization, and awareness of errors. SLP explained that deficits may be highlihgted in more novel tasks or tasks pt has not performed in a long time, moreso than in routine tasks. Additionally, pt also showed difficulty with attention and memory on the evaluation completed last session. (Cognition 21 minutes) Pt stated he missed 2 administrations of meds in 90 days - questioned rationale for STG re: medication  administration. SLP encouraged pt to ask primary SLP about this as this information was gathered during evaluation. SLP worked through a simple planning/organization task (simple version of "planning your day") with pt targeting alternating attention as well. Pt req'd mod cues from SLP for timing of tasks correctly. (e.g., had 90 minutes to shop for a watch, and 60 minutes to get to supermarket and purchase 3 items). Pt possibly explained away deficits by saying, "I was just organizing to get the idea of how to sequence the tasks." SLP told pt to complete this task at home and provided one more task targeting attention/attention to details.      Assessment / Recommendations / Plan   Plan Continue with current plan of care      Progression Toward Goals   Progression toward goals Progressing toward goals            SLP Education - 06/01/20 1715    Education Details deficit areas, eval results    Person(s) Educated Patient    Methods Explanation;Demonstration    Comprehension Verbalized understanding;Returned demonstration;Need further instruction            SLP Short Term Goals - 06/01/20 1718      SLP SHORT TERM GOAL #1   Title Pt will use external aids to manage medications with no missed meds or double doses over 2 weeks.    Time 4    Period Weeks    Status On-going  SLP SHORT TERM GOAL #2   Title Pt will reports loosing less than 6 items over a week with occasional min A from family    Time 4    Period Weeks    Status On-going      SLP SHORT TERM GOAL #3   Title Pt will navigate remote  with external aids to select 2 streaming platforms with rare min A from family.    Time 4    Period Weeks    Status On-going            SLP Long Term Goals - 06/01/20 1719      SLP LONG TERM GOAL #1   Title Pt will use memory system to manage appointments, daily to do list/schedule with occassional min A from family    Time 8    Period Weeks    Status On-going      SLP LONG  TERM GOAL #2   Title Pt will carryover 3 compensatory strategies to recall information from his family and doctors with occassional min A    Time 8    Period Weeks    Status On-going      SLP LONG TERM GOAL #3   Title Pt will ID/correct errors on cognitive linguistic tasks with occasional min A over 3 sessions    Time 8    Period Weeks    Status On-going            Plan - 06/01/20 1717    Clinical Impression Statement Pt was seen for ST evaluation on 04/28/20 with a diagnosis of PCA. Pt reports difficulty with word finding, picking items up and losing items around the house 2/2 visual impairment. Pt demonstrated relative strengths in auditory comprehension and expressive language. Pt was given CLQT during session. Pt presents with moderate cognitive linguistic impairments. Pt demonstrated difficulty with visual memory and executive functioning skills (i.e. attention, organization/planning). Deficits were also seen with emergent awareness. Pt would benefit from skilled speech therapy services to address cognitive-communication deficit. Therapy services will include restorative and compensatory therapies.    Speech Therapy Frequency 2x / week    Duration 8 weeks   or 17 visits   Treatment/Interventions Language facilitation;Environmental controls;Cueing hierarchy;SLP instruction and feedback;Compensatory techniques;Cognitive reorganization;Functional tasks;Compensatory strategies;Internal/external aids;Multimodal communcation approach;Patient/family education    Potential to Achieve Goals Good    Potential Considerations Ability to learn/carryover information;Medical prognosis    SLP Home Exercise Plan To be discussed next session.    Consulted and Agree with Plan of Care Patient           Patient will benefit from skilled therapeutic intervention in order to improve the following deficits and impairments:   Cognitive communication deficit    Problem List Patient Active Problem List    Diagnosis Date Noted  . Unintended weight loss 03/16/2020  . Central sleep apnea 03/16/2020  . Cognitive deficit with impaired visuospatial function 03/16/2020  . Mild obstructive sleep apnea-hypopnea syndrome 03/16/2020  . Posterior cortical atrophy ([place]) 03/09/2020  . Uncontrolled type 2 diabetes mellitus with hyperglycemia ([place]) 02/02/2020  . Hypercholesterolemia 02/02/2020    [facility] ,[place], [place]  06/01/2020, 5:20 PM  [facility] [address] [place], [place], [address] Phone: [phone]   Fax:  [phone]   Name: Joshua Lamb MRN: [id number] Date of Birth: 02-01-1956

## 2020-06-01 NOTE — Therapy (Signed)
[facility] [address] [place], [place], [address] Phone: [phone]   Fax:  [phone]  Occupational Therapy Treatment  Patient Details  Name: Joshua Lamb MRN: [id number] Date of Birth: 1955/09/06 Referring Provider (OT): Cecil Cobbs   Encounter Date: 06/01/2020   OT End of Session - 06/01/20 1526    Visit Number 4    Number of Visits 9    Date for OT Re-Evaluation 07/12/20    Authorization Type UMR - [place]    OT Start Time 1400    OT Stop Time 1445    OT Time Calculation (min) 45 min    Activity Tolerance Patient tolerated treatment well    Behavior During Therapy [facility] for tasks assessed/performed           Past Medical History:  Diagnosis Date   Arthritis    Diabetes mellitus without complication (HCC)    GERD (gastroesophageal reflux disease)    High cholesterol     Past Surgical History:  Procedure Laterality Date   CLEFT LIP REPAIR     CLEFT PALATE REPAIR      There were no vitals filed for this visit.   Subjective Assessment - 06/01/20 1411    Subjective  Driving is going well. Patient has had driving eval 07/03/91 toensure safety with driving    Pertinent History DM, Hyperlipidemia    Currently in Pain? No/denies    Multiple Pain Sites No                        OT Treatments/Exercises (OP) - 06/01/20 0001      ADLs   Home Maintenance Patient and wife had an opportunity to begin to clean out workshop in garage.    Writing Discussion regarding talk to text options on phone or ipad.  Patient has had difficulty typing and organizing / self correcting information.  Patient feels thoughs are clear, but getting thoughts onto paper is challenging - words become smaller, run down the page, reversals  b/d occur, errors - missing or extraneous letters - often without his awareness.  May find talk to text technology helpful for work, and social groups where he needs to  contribute.  Sons/wife supportive and may be able to help explore these options.      Visual/Perceptual Exercises   Other Exercises Reviewed results of MVPT-R - patient with significant difficulty with both visual closure, and visual discrimination.                  OT Education - 06/01/20 1525    Education Details talk to text options to reduce errors    Person(s) Educated Patient    Methods Explanation    Comprehension Verbalized understanding            OT Short Term Goals - 05/25/20 1327      OT SHORT TERM GOAL #1   Title Patient will demonstrate awareness of home activities program to address visual organizational skills with min cueing    Time 4    Period Weeks    Status On-going      OT SHORT TERM GOAL #2   Title Patient will demonstrate tabletop visual scanning in busy distracting environment with 75% accuracy.    Time 4    Period Weeks    Status On-going             OT Long Term Goals - 05/25/20 1331  OT LONG TERM GOAL #1   Title Patient will self report improvement in locating common items used daily, e.g. water bottle, glasses    Time 8    Period Weeks    Status On-going      OT LONG TERM GOAL #2   Title Patient will self report improvement in typing skills, or will investigate talk to text technology    Time 8    Period Weeks    Status On-going      OT LONG TERM GOAL #3   Title Patient will demonstrate error recognition when transcribing letters incorrectly into word games/puzzles    Time 8    Period Weeks    Status On-going                 Plan - 06/01/20 1526    Clinical Impression Statement Patient and wife exploring talk to text options to help with communication    OT Occupational Profile and History Detailed Assessment- Review of Records and additional review of physical, cognitive, psychosocial history related to current functional performance    Occupational performance deficits (Please refer to evaluation for  details): ADL's;IADL's;Work    Body Structure / Function / Physical Skills ADL;Coordination;Sensation;IADL;Dexterity;FMC;Vision    Cognitive Skills Attention;Orientation;Memory    Rehab Potential Good    Clinical Decision Making Limited treatment options, no task modification necessary    Comorbidities Affecting Occupational Performance: None    Modification or Assistance to Complete Evaluation  No modification of tasks or assist necessary to complete eval    OT Frequency 1x / week    OT Duration 8 weeks    OT Treatment/Interventions Self-care/ADL training;Therapeutic exercise;Visual/perceptual remediation/compensation;Coping strategies training;Patient/family education;Neuromuscular education;Therapeutic activities;Cognitive remediation/compensation    Plan patient should bring jumble puzzle, review if found talk to text options, check goals    Consulted and Agree with Plan of Care Patient           Patient will benefit from skilled therapeutic intervention in order to improve the following deficits and impairments:   Body Structure / Function / Physical Skills: ADL,Coordination,Sensation,IADL,Dexterity,FMC,Vision Cognitive Skills: Attention,Orientation,Memory     Visit Diagnosis: Visuospatial deficit  Attention and concentration deficit  Other lack of coordination    Problem List Patient Active Problem List   Diagnosis Date Noted   Unintended weight loss 03/16/2020   Central sleep apnea 03/16/2020   Cognitive deficit with impaired visuospatial function 03/16/2020   Mild obstructive sleep apnea-hypopnea syndrome 03/16/2020   Posterior cortical atrophy ([place]) 03/09/2020   Uncontrolled type 2 diabetes mellitus with hyperglycemia ([place]) 02/02/2020   Hypercholesterolemia 02/02/2020    Mariah Milling, OTR/L 06/01/2020, 3:28 PM  [facility] [address] [place], [place], [address] Phone: [phone]    Fax:  [phone]  Name: Joshua Lamb MRN: [id number] Date of Birth: 1955-09-24

## 2020-06-01 NOTE — Patient Instructions (Signed)
  Please complete the assigned speech therapy homework prior to your next session and return it to the speech therapist at your next visit.  

## 2020-06-02 ENCOUNTER — Ambulatory Visit: Payer: 59

## 2020-06-02 DIAGNOSIS — R41841 Cognitive communication deficit: Secondary | ICD-10-CM | POA: Diagnosis not present

## 2020-06-02 DIAGNOSIS — R278 Other lack of coordination: Secondary | ICD-10-CM | POA: Diagnosis not present

## 2020-06-02 DIAGNOSIS — R41842 Visuospatial deficit: Secondary | ICD-10-CM | POA: Diagnosis not present

## 2020-06-02 DIAGNOSIS — R41840 Attention and concentration deficit: Secondary | ICD-10-CM | POA: Diagnosis not present

## 2020-06-02 NOTE — Patient Instructions (Signed)
   Constant Therapy app - App Store or [employer] "Real data" that link skills that you need to do the app to real life improvements (unlike Lumosity)  Free for 14 days - everything is free! $29.99/month afterwards - 15% off code: [email or web address]  Look at information about the app if you like: [email or web address]

## 2020-06-02 NOTE — Therapy (Signed)
[facility] [address] [place], [place], [address] Phone: [phone]   Fax:  [phone]  Speech Language Pathology Treatment  Patient Details  Name: Joshua Lamb MRN: [id number] Date of Birth: 1955-08-14 Referring Provider (SLP): Cecil Cobbs   Encounter Date: 06/02/2020   End of Session - 06/02/20 1727    Visit Number 3    Number of Visits 17    Date for SLP Re-Evaluation 06/23/20    Authorization Type Auth required after 25th visit    SLP Start Time 1405    SLP Stop Time  1445    SLP Time Calculation (min) 40 min    Activity Tolerance Patient tolerated treatment well           Past Medical History:  Diagnosis Date  . Arthritis   . Diabetes mellitus without complication ([place])   . GERD (gastroesophageal reflux disease)   . High cholesterol     Past Surgical History:  Procedure Laterality Date  . CLEFT LIP REPAIR    . CLEFT PALATE REPAIR      There were no vitals filed for this visit.          ADULT SLP TREATMENT - 06/02/20 1708      General Information   Behavior/Cognition Alert;Cooperative;Pleasant mood      Cognitive-Linquistic Treatment   Treatment focused on Cognition;Patient/family/caregiver education    Skilled Treatment Pt brought in the "scheduling your day" task with very verbose responses for a plan for the entire day. Pt told SLP, "You said you wanted me to be very detailed," but SLP told [facility] to [facility] about the details surrounding the time it would take for each of the 4 errands (see note from  yesterday). Joshua Lamb shared that, generally, he does not feel he has difficulty with organization and planning - however SLP suspects pt does not have full insight into these deficits because 5-7 minutes later Joshua Lamb explained a project he completed that resulted in errors being made with the final result (handles put on a furniture piece in a crooked/inclined manner). SLP told Joshua Lamb that  SLP suspects pt's plan was not fully thought out (pt did not adequately plan) and so completed the project with a faulty plan. An example of reduced error awareness was shared when Joshua Lamb told SLP he tried to put on a coat that had a male zipper (a male's coat) and it did not occur to him that the jacket was not his jacket after >10 faulty attempts to zip the zipper. SLP used this example to highlight pt's possible decr'd error awareness. Pt is considering d/c ST and working with Joshua Lamb instead, and remaining with OT, or remaining with OT and with ST with another therapist. SLP reiterated the SLPs want him to do what he feels will serve him best.      Assessment / Recommendations / Plan   Plan Continue with current plan of care   possible d/c (pt to decide)     Progression Toward Goals   Progression toward goals Not progressing toward goals (comment)   ? pt awareness/insight into deficits           SLP Education - 06/02/20 1726    Education Details deficit areas, Joshua Lamb    Person(s) Educated Patient    Methods Explanation;Verbal cues    Comprehension Verbal cues required;Need further instruction            SLP Short Term Goals - 06/02/20 1728  SLP SHORT TERM GOAL #1   Title Pt will use external aids to manage medications with no missed meds or double doses over 2 weeks.    Time --    Period --    Status Deferred   pt report of missing 2 doses over 90 days     SLP SHORT TERM GOAL #2   Title Pt will reports loosing less than 6 items over a week with occasional min A from family    Time 4    Period Weeks    Status On-going      SLP SHORT TERM GOAL #3   Title Pt will navigate remote  with external aids to select 2 streaming platforms with rare min A from family.    Time 4    Period Weeks    Status On-going            SLP Long Term Goals - 06/02/20 1728      SLP LONG TERM GOAL #1   Title Pt will use memory system to manage appointments, daily to  do list/schedule with occassional min A from family    Time 8    Period Weeks    Status On-going      SLP LONG TERM GOAL #2   Title Pt will carryover 3 compensatory strategies to recall information from his family and doctors with occassional min A    Time 8    Period Weeks    Status On-going      SLP LONG TERM GOAL #3   Title Pt will ID/correct errors on cognitive linguistic tasks with occasional min A over 3 sessions    Time 8    Period Weeks    Status On-going            Plan - 06/02/20 1727    Clinical Impression Statement Joshua Lamb cont to present with moderate cognitive linguistic impairments. Pt demonstrated difficulty with visual memory and executive functioning skills (i.e. attention, organization/planning). Deficits were also seen with emergent awareness (error awareness). Pt would benefit from skilled speech Lamb services to address cognitive-communication deficit. Lamb services will include restorative and compensatory therapies. Pt to decide if he wants to cont with ST or complete Joshua Lamb tasks instead.    Speech Lamb Frequency 2x / week    Duration 8 weeks   or 17 visits   Treatment/Interventions Language facilitation;Environmental controls;Cueing hierarchy;SLP instruction and feedback;Compensatory techniques;Cognitive reorganization;Functional tasks;Compensatory strategies;Internal/external aids;Multimodal communcation approach;Patient/family education    Potential to Achieve Goals Good    Potential Considerations Ability to learn/carryover information;Medical prognosis    SLP Home Exercise Plan To be discussed next session.    Consulted and Agree with Plan of Care Patient           Patient will benefit from skilled therapeutic intervention in order to improve the following deficits and impairments:   Cognitive communication deficit    Problem List Patient Active Problem List   Diagnosis Date Noted  . Unintended weight loss 03/16/2020  .  Central sleep apnea 03/16/2020  . Cognitive deficit with impaired visuospatial function 03/16/2020  . Mild obstructive sleep apnea-hypopnea syndrome 03/16/2020  . 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 Uncontrolled type 2 diabetes mellitus with hyperglycemia ([place]) 02/02/2020  . Hypercholesterolemia 02/02/2020    Soua Lenk ,MS, CCC-SLP  06/02/2020, 5:29 PM  [facility] [address] [place], [place], [address] Phone: [phone]   Fax:  [phone]   Name: Joshua Lamb MRN:  [id number] Date of Birth: [birth year]

## 2020-06-04 ENCOUNTER — Encounter: Payer: 59 | Admitting: Speech Pathology

## 2020-06-06 ENCOUNTER — Encounter: Payer: Self-pay | Admitting: Internal Medicine

## 2020-06-08 ENCOUNTER — Encounter: Payer: 59 | Admitting: Occupational Therapy

## 2020-06-09 ENCOUNTER — Ambulatory Visit: Payer: 59 | Admitting: Occupational Therapy

## 2020-06-09 ENCOUNTER — Other Ambulatory Visit: Payer: Self-pay

## 2020-06-09 ENCOUNTER — Ambulatory Visit: Payer: 59

## 2020-06-09 DIAGNOSIS — R41841 Cognitive communication deficit: Secondary | ICD-10-CM | POA: Diagnosis not present

## 2020-06-09 DIAGNOSIS — R41842 Visuospatial deficit: Secondary | ICD-10-CM | POA: Diagnosis not present

## 2020-06-09 DIAGNOSIS — R41840 Attention and concentration deficit: Secondary | ICD-10-CM

## 2020-06-09 DIAGNOSIS — R278 Other lack of coordination: Secondary | ICD-10-CM

## 2020-06-09 NOTE — Therapy (Signed)
[facility] [address] [place], [place], [address] Phone: [phone]   Fax:  [phone]  Occupational Therapy Treatment  Patient Details  Name: Joshua Lamb MRN: [id number] Date of Birth: [birth year] Referring Provider (OT): Cecil Cobbs   Encounter Date: 06/09/2020   OT End of Session - 06/09/20 1459    Visit Number 5    Number of Visits 9    Date for OT Re-Evaluation 07/12/20    Authorization Type UMR - [place]    OT Start Time 1400    OT Stop Time 1445    OT Time Calculation (min) 45 min    Activity Tolerance Patient tolerated treatment well    Behavior During Therapy [facility] for tasks assessed/performed           Past Medical History:  Diagnosis Date  . Arthritis   . Diabetes mellitus without complication ([place])   . GERD (gastroesophageal reflux disease)   . High cholesterol     Past Surgical History:  Procedure Laterality Date  . CLEFT LIP REPAIR    . CLEFT PALATE REPAIR      There were no vitals filed for this visit.   Subjective Assessment - 06/09/20 1449    Subjective  Patient reports that he has determined a landing spot for common items in his home.  This has been helpful in reducing times locating items    Pertinent History DM, Hyperlipidemia    Currently in Pain? No/denies                        OT Treatments/Exercises (OP) - 06/09/20 0001      ADLs   Grooming Patient reports difficulty with locating grooming items in bathroom.  Have discussed possibly organizing drawer or small container on top of vanity with daily needed items.  Patient to discuss with wife to determine reasonable solutions to help him visually organize space.  Discussed benefit of routinizing daily tasks to save time and mental energy.    Home Maintenance Patient feeling good about starting garage project.  Thinned out 1.5 buckets of material and feels he can chunk project into manageable sizes  over time as weather allows.    Leisure Patient enjoys jumble puzzles from newspaper - visually challenging.  Discussed options to use reading guide to highlight clues and puzzle, and also to occlude extraneous stimuli.  Patient feels this may be helpful for puzzles.  Discussed benefit of outlining puzzle to know appropriate spacing.      Cognitive Exercises   Other Cognitive Exercises 1 Patient is exploring talk to text technology.  Now using Siri to put appointments in calendar                  OT Education - 06/09/20 1459    Education Details reviewed progress toward goals, use of reading guides, and visual organization of daily grooming supplies    Person(s) Educated Patient    Methods Explanation    Comprehension Verbalized understanding            OT Short Term Goals - 06/09/20 1418      OT SHORT TERM GOAL #1   Title Patient will demonstrate awareness of home activities program to address visual organizational skills with min cueing    Time 4    Period Weeks    Status Achieved      OT SHORT TERM GOAL #2   Title Patient will demonstrate tabletop  visual scanning in busy distracting environment with 75% accuracy.    Time 4    Period Weeks    Status On-going             OT Long Term Goals - 06/09/20 1428      OT LONG TERM GOAL #1   Title Patient will self report improvement in locating common items used daily, e.g. water bottle, glasses    Time 8    Period Weeks    Status Achieved      OT LONG TERM GOAL #2   Title Patient will self report improvement in typing skills, or will investigate talk to text technology    Time 8    Period Weeks    Status On-going      OT LONG TERM GOAL #3   Title Patient will demonstrate error recognition when transcribing letters incorrectly into word games/puzzles    Time 8    Period Weeks    Status On-going                 Plan - 06/09/20 1502    Clinical Impression Statement Patient feels he has made some  progress with self organization at home, and he is exploring options for text to type    OT Occupational Profile and History Detailed Assessment- Review of Records and additional review of physical, cognitive, psychosocial history related to current functional performance    Occupational performance deficits (Please refer to evaluation for details): ADL's;IADL's;Work    Body Structure / Function / Physical Skills ADL;Coordination;Sensation;IADL;Dexterity;FMC;Vision    Cognitive Skills Attention;Orientation;Memory    Rehab Potential Good    Clinical Decision Making Limited treatment options, no task modification necessary    Comorbidities Affecting Occupational Performance: None    Modification or Assistance to Complete Evaluation  No modification of tasks or assist necessary to complete eval    OT Frequency 1x / week    OT Duration 8 weeks    OT Treatment/Interventions Self-care/ADL training;Therapeutic exercise;Visual/perceptual remediation/compensation;Coping strategies training;Patient/family education;Neuromuscular education;Therapeutic activities;Cognitive remediation/compensation    Plan Check on organization of grooming items, how did reading guide work    Consulted and Agree with Plan of Care Patient           Patient will benefit from skilled therapeutic intervention in order to improve the following deficits and impairments:   Body Structure / Function / Physical Skills: ADL,Coordination,Sensation,IADL,Dexterity,FMC,Vision Cognitive Skills: Attention,Orientation,Memory     Visit Diagnosis: Visuospatial deficit  Attention and concentration deficit  Other lack of coordination    Problem List Patient Active Problem List   Diagnosis Date Noted  . Unintended weight loss 03/16/2020  . Central sleep apnea 03/16/2020  . Cognitive deficit with impaired visuospatial function 03/16/2020  . Mild obstructive sleep apnea-hypopnea syndrome 03/16/2020  . Posterior cortical atrophy  ([place]) 03/09/2020  . Uncontrolled type 2 diabetes mellitus with hyperglycemia ([place]) 02/02/2020  . Hypercholesterolemia 02/02/2020    Mariah Milling, OTR/L 06/09/2020, 3:07 PM  [facility] [address] [place], [place], [address] Phone: [phone]   Fax:  [phone]  Name: MALEKE FERIA MRN: [id number] Date of Birth: 1955-12-12

## 2020-06-10 ENCOUNTER — Other Ambulatory Visit (HOSPITAL_COMMUNITY): Payer: Self-pay | Admitting: Internal Medicine

## 2020-06-10 MED FILL — ASPIRIN LOW DOSE 81 MG CHEW: 81 | 90 days supply | Qty: 90 | Fill #0

## 2020-06-10 MED FILL — ROSUVASTATIN CALCIUM 20 MG: 20 | 90 days supply | Qty: 90 | Fill #0

## 2020-06-15 ENCOUNTER — Encounter: Payer: 59 | Admitting: Occupational Therapy

## 2020-06-16 ENCOUNTER — Ambulatory Visit: Payer: 59 | Admitting: Occupational Therapy

## 2020-06-16 ENCOUNTER — Ambulatory Visit: Payer: 59

## 2020-06-18 ENCOUNTER — Ambulatory Visit: Payer: 59

## 2020-06-22 ENCOUNTER — Ambulatory Visit: Payer: 59 | Admitting: Occupational Therapy

## 2020-06-22 ENCOUNTER — Encounter: Payer: Self-pay | Admitting: Occupational Therapy

## 2020-06-22 ENCOUNTER — Other Ambulatory Visit: Payer: Self-pay

## 2020-06-22 DIAGNOSIS — R41842 Visuospatial deficit: Secondary | ICD-10-CM | POA: Diagnosis not present

## 2020-06-22 DIAGNOSIS — R278 Other lack of coordination: Secondary | ICD-10-CM

## 2020-06-22 DIAGNOSIS — R41840 Attention and concentration deficit: Secondary | ICD-10-CM

## 2020-06-22 DIAGNOSIS — R41841 Cognitive communication deficit: Secondary | ICD-10-CM | POA: Diagnosis not present

## 2020-06-22 NOTE — Therapy (Signed)
[facility] [address] [place], [place], [address] Phone: [phone]   Fax:  [phone]  Occupational Therapy Treatment  Patient Details  Name: Joshua Lamb MRN: [id number] Date of Birth: 1955-08-16 Referring Provider (OT): Cecil Cobbs   Encounter Date: 06/22/2020   OT End of Session - 06/22/20 1521    Visit Number 6    Number of Visits 9    Date for OT Re-Evaluation 07/12/20    Authorization Type [employer] [place]    OT Start Time 1230    OT Stop Time 1313    OT Time Calculation (min) 43 min    Activity Tolerance Patient tolerated treatment well    Behavior During Therapy [facility] for tasks assessed/performed           Past Medical History:  Diagnosis Date  . Arthritis   . Diabetes mellitus without complication ([place])   . GERD (gastroesophageal reflux disease)   . High cholesterol     Past Surgical History:  Procedure Laterality Date  . CLEFT LIP REPAIR    . CLEFT PALATE REPAIR      There were no vitals filed for this visit.   Subjective Assessment - 06/22/20 1238    Subjective  I have constant therapy - math skills improving    Pertinent History DM, Hyperlipidemia    Currently in Pain? No/denies    Multiple Pain Sites No                        OT Treatments/Exercises (OP) - 06/22/20 0001      ADLs   Home Maintenance Patient reports continung to make progress with garage project - cleaning and thinning out materials to maximize what is accessible to him - less cluttered.    ADL Comments Reviewed remaining goals and patient has met all OT goals at this time.  Discussed that as disease progresses he may be in and out of therapy as needed.  Patient pleased with his progress.      Visual/Perceptual Exercises   Other Exercises Visual scanning in busy environment.  Used line guide to reduce over visual stimulation.  Cueing to slow down and check work.  Patient with best response  after some repetition of task, and cueing regarding accuracy versus speed.  Patient's error were most common in central visual field.  Patient missed a recent eye exam, encouraged him to reschedule to rule out any actual acuity issues versus visual procesing/ visual attention which seems more likely.                  OT Education - 06/22/20 1521    Education Details reviewed remaining goals and plan for discharge as goals met    Person(s) Educated Patient    Methods Explanation    Comprehension Verbalized understanding            OT Short Term Goals - 06/22/20 1523      OT SHORT TERM GOAL #1   Title Patient will demonstrate awareness of home activities program to address visual organizational skills with min cueing    Time 4    Period Weeks    Status Achieved      OT SHORT TERM GOAL #2   Title Patient will demonstrate tabletop visual scanning in busy distracting environment with 75% accuracy.    Time 4    Period Weeks    Status Achieved  OT Long Term Goals - 06/22/20 1302      OT LONG TERM GOAL #1   Title Patient will self report improvement in locating common items used daily, e.g. water bottle, glasses    Time 8    Period Weeks    Status Achieved      OT LONG TERM GOAL #2   Title Patient will self report improvement in typing skills, or will investigate talk to text technology    Time 8    Period Weeks    Status Achieved      OT LONG TERM GOAL #3   Title Patient will demonstrate error recognition when transcribing letters incorrectly into word games/puzzles    Time 8    Period Weeks    Status Achieved                 Plan - 06/22/20 1522    Clinical Impression Statement Patient is pleased with areas where he has seen functional improvement either through remediation of deficits or compensatory strategies.    OT Occupational Profile and History Detailed Assessment- Review of Records and additional review of physical, cognitive,  psychosocial history related to current functional performance    Occupational performance deficits (Please refer to evaluation for details): ADL's;IADL's;Work    Body Structure / Function / Physical Skills ADL;Coordination;Sensation;IADL;Dexterity;FMC;Vision    Cognitive Skills Attention;Orientation;Memory    Rehab Potential Good    Clinical Decision Making Limited treatment options, no task modification necessary    Comorbidities Affecting Occupational Performance: None    Modification or Assistance to Complete Evaluation  No modification of tasks or assist necessary to complete eval    OT Frequency 1x / week    OT Duration 8 weeks    OT Treatment/Interventions Self-care/ADL training;Therapeutic exercise;Visual/perceptual remediation/compensation;Coping strategies training;Patient/family education;Neuromuscular education;Therapeutic activities;Cognitive remediation/compensation    Plan discharge from OT    Consulted and Agree with Plan of Care Patient           Patient will benefit from skilled therapeutic intervention in order to improve the following deficits and impairments:   Body Structure / Function / Physical Skills: ADL,Coordination,Sensation,IADL,Dexterity,FMC,Vision Cognitive Skills: Attention,Orientation,Memory     Visit Diagnosis: Visuospatial deficit  Attention and concentration deficit  Other lack of coordination    Problem List Patient Active Problem List   Diagnosis Date Noted  . Unintended weight loss 03/16/2020  . Central sleep apnea 03/16/2020  . Cognitive deficit with impaired visuospatial function 03/16/2020  . Mild obstructive sleep apnea-hypopnea syndrome 03/16/2020  . 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 Uncontrolled type 2 diabetes mellitus with hyperglycemia ([place]) 02/02/2020  . Hypercholesterolemia 02/02/2020   OCCUPATIONAL THERAPY DISCHARGE SUMMARY  Visits from Start of Care: 6  Current functional level related to goals /  functional outcomes: Improved compensation for visual attention, and attention, exploring technology to assist    Remaining deficits: Visual perceptual skills, visual  attention, alternating attention   Education / Equipment: Reading guide, talk to text - smart phone assist, reducing extraneous visual stimulation where possible Plan: Patient agrees to discharge.  Patient goals were met. Patient is being discharged due to meeting the stated rehab goals.  ?????      Mariah Milling, OTR/L 06/22/2020, Ester Rink PM  [facility] [address] [place], [place], [address] Phone: [phone]   Fax:  [phone]  Name: Joshua Lamb MRN: [id number] Date of Birth: April 15, 1955

## 2020-06-25 ENCOUNTER — Other Ambulatory Visit (HOSPITAL_BASED_OUTPATIENT_CLINIC_OR_DEPARTMENT_OTHER): Payer: Self-pay

## 2020-06-28 MED FILL — JARDIANCE 10 MG TABLET: 10 | 30 days supply | Qty: 30 | Fill #0

## 2020-06-29 ENCOUNTER — Encounter: Payer: 59 | Admitting: Occupational Therapy

## 2020-06-29 ENCOUNTER — Other Ambulatory Visit (HOSPITAL_COMMUNITY): Payer: Self-pay | Admitting: Neurology

## 2020-06-29 DIAGNOSIS — G319 Degenerative disease of nervous system, unspecified: Secondary | ICD-10-CM | POA: Diagnosis not present

## 2020-06-29 DIAGNOSIS — G311 Senile degeneration of brain, not elsewhere classified: Secondary | ICD-10-CM | POA: Diagnosis not present

## 2020-06-29 DIAGNOSIS — R41842 Visuospatial deficit: Secondary | ICD-10-CM | POA: Diagnosis not present

## 2020-06-29 MED FILL — DONEPEZIL HCL 5 MG TABLET: 5 | 90 days supply | Qty: 90 | Fill #0

## 2020-07-03 ENCOUNTER — Other Ambulatory Visit (HOSPITAL_COMMUNITY): Payer: Self-pay

## 2020-07-03 MED FILL — Pioglitazone HCl Tab 30 MG (Base Equiv): ORAL | 90 days supply | Qty: 90 | Fill #0 | Status: AC

## 2020-07-04 ENCOUNTER — Other Ambulatory Visit (HOSPITAL_COMMUNITY): Payer: Self-pay

## 2020-07-05 ENCOUNTER — Other Ambulatory Visit (HOSPITAL_COMMUNITY): Payer: Self-pay

## 2020-07-05 MED FILL — Glucose Blood Test Strip: 90 days supply | Qty: 100 | Fill #0 | Status: AC

## 2020-07-19 ENCOUNTER — Other Ambulatory Visit (HOSPITAL_COMMUNITY): Payer: Self-pay

## 2020-07-19 MED FILL — Metformin HCl Tab 1000 MG: ORAL | 90 days supply | Qty: 180 | Fill #0 | Status: AC

## 2020-07-22 ENCOUNTER — Other Ambulatory Visit: Payer: Self-pay

## 2020-07-22 ENCOUNTER — Other Ambulatory Visit (INDEPENDENT_AMBULATORY_CARE_PROVIDER_SITE_OTHER): Payer: 59

## 2020-07-22 DIAGNOSIS — E119 Type 2 diabetes mellitus without complications: Secondary | ICD-10-CM

## 2020-07-22 LAB — BASIC METABOLIC PANEL
BUN: 24 mg/dL — ABNORMAL HIGH (ref 6–23)
CO2: 29 mEq/L (ref 19–32)
Calcium: 9.9 mg/dL (ref 8.4–10.5)
Chloride: 100 mEq/L (ref 96–112)
Creatinine, Ser: 0.89 mg/dL (ref 0.40–1.50)
GFR: 89.56 mL/min (ref >60.00)
Glucose, Bld: 115 mg/dL — ABNORMAL HIGH (ref 70–99)
Potassium: 4.2 mEq/L (ref 3.5–5.1)
Sodium: 137 mEq/L (ref 135–145)

## 2020-07-22 LAB — HEMOGLOBIN A1C: Hgb A1c MFr Bld: 7.3 % — ABNORMAL HIGH (ref 4.6–6.5)

## 2020-07-27 ENCOUNTER — Other Ambulatory Visit (HOSPITAL_COMMUNITY): Payer: Self-pay

## 2020-07-27 MED FILL — Empagliflozin Tab 10 MG: ORAL | 30 days supply | Qty: 30 | Fill #0 | Status: AC

## 2020-07-28 ENCOUNTER — Ambulatory Visit: Payer: 59 | Admitting: Endocrinology

## 2020-07-28 ENCOUNTER — Encounter: Payer: Self-pay | Admitting: Endocrinology

## 2020-07-28 ENCOUNTER — Other Ambulatory Visit: Payer: Self-pay

## 2020-07-28 VITALS — BP 105/65 | HR 61 | Ht 69.0 in | Wt 144.0 lb

## 2020-07-28 DIAGNOSIS — E1165 Type 2 diabetes mellitus with hyperglycemia: Secondary | ICD-10-CM | POA: Diagnosis not present

## 2020-07-28 DIAGNOSIS — R42 Dizziness and giddiness: Secondary | ICD-10-CM | POA: Diagnosis not present

## 2020-07-28 MED ORDER — EMPAGLIFLOZIN 10 MG PO TABS: ORAL_TABLET | Freq: Every day | ORAL | 3 refills | Status: DC

## 2020-07-28 NOTE — Patient Instructions (Signed)
Check blood sugars on waking up 3 days a week  Also check blood sugars about 2 hours after meals and do this after different meals by rotation  Recommended blood sugar levels on waking up are 90-130 and about 2 hours after meal is 130-160  Please bring your blood sugar monitor to each visit, thank you   

## 2020-07-28 NOTE — Progress Notes (Signed)
Patient ID: Joshua Lamb, male   DOB: 02/22/1956, 65 y.o.   MRN: [id number]           Reason for Appointment: Follow-up for Type 2 Diabetes  Referring PCP: Marton Redwood   History of Present Illness:          Date of diagnosis of type 2 diabetes mellitus:   2017    Background history:  He was diagnosed when he had blood sugars above normal without any symptoms He believes his highest A1c was 7.8 Initially was treated with Metformin and apparently because of inadequate level of control he was given 25 mg Jardiance also probably in 2017 Apparently his weight prior to starting Jardiance was 190 pounds He progressively lost weight with this and his A1c has been between 6.3 and 7.3 since 2020  Recent history:   Most recent A1c is 7.3 Fructosamine last 280  Non-insulin hypoglycemic drugs the patient is taking are: Metformin 1 g twice daily, Jardiance 10 mg daily, Actos 30 mg daily  Current management, blood sugar patterns and problems identified:  He is now mostly checking blood sugars in the mornings and not consistently also  His highest blood sugar has been 154 fasting about 3 weeks ago but lab glucose was 115  He said that he is not changing his diet and usually getting some protein with most meals including breakfast  Usually eating relatively small carbohydrate meals otherwise  Currently taking 10 mg Jardiance instead of previously used 25 mg and has no side effects except mild increase in urination with this  Weight has come up 4 pounds  No edema with Actos  He tries to use his exercise bike about 3 times a week regularly        Side effects from medications have been: None     Meal times are:  Breakfast is at 7 AM, dinner 6:30 PM  Typical meal intake: Breakfast is   toast with eggs and sausage or oatmeal with 4 ounces of juice.  Dinner usually salad, vegetables and protein.  Snacks apple or peanuts     Only occasionally will go to fast food restaurants           Glucose monitoring:  done <1 times a day         Glucometer:  Freestyle lite     Blood Glucose readings by download of meter   PRE-MEAL Fasting Lunch Dinner Bedtime Overall  Glucose range:  112-154   95    Mean/median:  129     135   POST-MEAL PC Breakfast PC Lunch PC Dinner  Glucose range:  216    Mean/median:       Previous data: Evening range 107-204, morning 136, 167  AVERAGE 141   Dietician visit, most recent: 2017  Weight history:  Wt Readings from Last 3 Encounters:  07/28/20 144 lb (65.3 kg)  04/28/20 140 lb (63.5 kg)  03/08/20 142 lb 3.2 oz (64.5 kg)    Glycemic control:   Lab Results  Component Value Date   HGBA1C 7.3 (H) 07/22/2020   HGBA1C 7.1 (H) 04/21/2020   HGBA1C 7.4 (A) 02/02/2020   Lab Results  Component Value Date   MICROALBUR 24 08/14/2019   LDLCALC 70 08/07/2019   CREATININE 0.89 07/22/2020   No results found for: [facility]  Lab Results  Component Value Date   FRUCTOSAMINE 280 02/25/2020    Lab on 07/22/2020  Component Date Value Ref Range Status  . Sodium 07/22/2020  137  135 - 145 mEq/L Final  . Potassium 07/22/2020 4.2  3.5 - 5.1 mEq/L Final  . Chloride 07/22/2020 100  96 - 112 mEq/L Final  . CO2 07/22/2020 29  19 - 32 mEq/L Final  . Glucose, Bld 07/22/2020 115* 70 - 99 mg/dL Final  . BUN 07/22/2020 24* 6 - 23 mg/dL Final  . Creatinine, Ser 07/22/2020 0.89  0.40 - 1.50 mg/dL Final  . GFR 07/22/2020 89.56  >60.00 mL/min Final   Calculated using the CKD-EPI Creatinine Equation (2021)  . Calcium 07/22/2020 9.9  8.4 - 10.5 mg/dL Final  . Hgb A1c MFr Bld 07/22/2020 7.3* 4.6 - 6.5 % Final   Glycemic Control Guidelines for People with Diabetes:Non Diabetic:  <6%Goal of Therapy: <7%Additional Action Suggested:  >8%     Allergies as of 07/28/2020   No Known Allergies     Medication List       Accurate as of July 28, 2020 11:47 AM. If you have any questions, ask your nurse or doctor.        acetaminophen 500 MG  tablet Commonly known as: TYLENOL Take 1,000 mg by mouth every 6 (six) hours as needed for moderate pain.   aspirin EC 81 MG tablet Take 81 mg by mouth at bedtime.   Aspirin Low Dose 81 MG chewable tablet Generic drug: aspirin CHEW 1 TABLET BY MOUTH ONCE A DAY   donepezil 5 MG tablet Commonly known as: ARICEPT TAKE 1/2 TABLET BY MOUTH EVERY MORNING WITH BREAKFAST FOR 1 MONTH. THEN TAKE 1 WHOLE TABLET DAILY WITH BREAKFAST.   freestyle lancets USE TO CHECK BLOOD SUGAR ONCE DAILY   FREESTYLE LITE test strip Generic drug: glucose blood USE TO CHECK BLOOD SUGAR ONCE DAILY.   FREESTYLE LITE test strip Generic drug: glucose blood 1 each by Other route as needed for other. Use as instructed to check blood sugar once a day dx code E11.65   Jardiance 10 MG Tabs tablet Generic drug: empagliflozin TAKE 1 TABLET BY MOUTH ONCE DAILY WITH BREAKFAST What changed:   how much to take  additional instructions   metFORMIN 1000 MG tablet Commonly known as: GLUCOPHAGE TAKE 1 TABLET BY MOUTH 2 TIMES DAILY WITH A MEAL   metFORMIN 1000 MG tablet Commonly known as: GLUCOPHAGE TAKE 1 TABLET BY MOUTH 2 TIMES DAILY WITH MEALS   pantoprazole 40 MG tablet Commonly known as: PROTONIX Take 40 mg by mouth daily as needed (for acid reflux).   pioglitazone 30 MG tablet Commonly known as: ACTOS TAKE 1 TABLET BY MOUTH ONCE A DAY   rosuvastatin 20 MG tablet Commonly known as: CRESTOR Take 20 mg by mouth at bedtime.   rosuvastatin 20 MG tablet Commonly known as: CRESTOR TAKE 1 TABLET BY MOUTH ONCE A DAY       Allergies: No Known Allergies  Past Medical History:  Diagnosis Date  . Arthritis   . Diabetes mellitus without complication ([place])   . GERD (gastroesophageal reflux disease)   . High cholesterol     Past Surgical History:  Procedure Laterality Date  . CLEFT LIP REPAIR    . CLEFT PALATE REPAIR      Family History  Problem Relation Age of Onset  . Diabetes Father   . Heart  disease Neg Hx     Social History:  reports that he has never smoked. He has never used smokeless tobacco. He reports current alcohol use. He reports that he does not use drugs.   Review of  Systems   Lipid history: Lipids controlled with 20 mg Crestor    Lab Results  Component Value Date   LDLCALC 70 08/07/2019   TRIG 62 08/07/2019           Blood pressure history:  More recently says that when he bends down and gets up he will feel lightheaded but no other time No swimmy headedness   BP Readings from Last 3 Encounters:  07/28/20 105/65  04/28/20 106/62  03/08/20 106/64    Most recent eye exam was in 9/21  Most recent foot exam: 11/21  Currently known complications of diabetes: None  LABS:  Lab on 07/22/2020  Component Date Value Ref Range Status  . Sodium 07/22/2020 137  135 - 145 mEq/L Final  . Potassium 07/22/2020 4.2  3.5 - 5.1 mEq/L Final  . Chloride 07/22/2020 100  96 - 112 mEq/L Final  . CO2 07/22/2020 29  19 - 32 mEq/L Final  . Glucose, Bld 07/22/2020 115* 70 - 99 mg/dL Final  . BUN 07/22/2020 24* 6 - 23 mg/dL Final  . Creatinine, Ser 07/22/2020 0.89  0.40 - 1.50 mg/dL Final  . GFR 07/22/2020 89.56  >60.00 mL/min Final   Calculated using the CKD-EPI Creatinine Equation (2021)  . Calcium 07/22/2020 9.9  8.4 - 10.5 mg/dL Final  . Hgb A1c MFr Bld 07/22/2020 7.3* 4.6 - 6.5 % Final   Glycemic Control Guidelines for People with Diabetes:Non Diabetic:  <6%Goal of Therapy: <7%Additional Action Suggested:  >8%     Physical Examination:  BP 105/65 (Patient Position: Standing)   Pulse 61   Ht 5\' 9"  (1.753 m)   Wt 144 lb (65.3 kg)   SpO2 94%   BMI 21.27 kg/m   No ankle edema present    ASSESSMENT:  Diabetes type 2 non-insulin-dependent  See history of present illness for detailed discussion of current diabetes management, blood sugar patterns and problems identified  A1c is now 7.3 compared to 7.1  He is on Metformin 1 g twice daily and Jardiance  10 mg as well as Actos 30 mg His blood sugars appear to be relatively lower compared to his A1c However he has not checked readings after meals and not clear if he has truly high readings after meals overall Weight loss previously was from [place] and now he has gained back 4 pounds especially with continuing Actos No side effects from any of the diabetes medications as discussed above He is exercising regularly Diet is usually fairly good  Orthostatic lightheadedness: Currently blood pressure is not low standing up, no other symptoms suggestive of adrenal insufficiency currently  PLAN:    No change in medications We can consider trying Jardiance in the evenings instead of morning to see if it reduces lightheadedness, currently he does not want to do this He will start checking more readings after meals and modify diet if needed Reminded him to have a protein with every meal especially if he is eating oatmeal in the morning  Check cortisol level on next lab  Patient Instructions  Check blood sugars on waking up 3 days a week  Also check blood sugars about 2 hours after meals and do this after different meals by rotation  Recommended blood sugar levels on waking up are 90-130 and about 2 hours after meal is 130-160  Please bring your blood sugar monitor to each visit, thank you       Elayne Snare 07/28/2020, 11:47 AM   Note: This office note was  prepared with Estate agent. Any transcriptional errors that result from this process are unintentional.

## 2020-08-09 DIAGNOSIS — G319 Degenerative disease of nervous system, unspecified: Secondary | ICD-10-CM | POA: Diagnosis not present

## 2020-08-09 DIAGNOSIS — Z79899 Other long term (current) drug therapy: Secondary | ICD-10-CM | POA: Diagnosis not present

## 2020-08-13 ENCOUNTER — Other Ambulatory Visit (HOSPITAL_COMMUNITY): Payer: Self-pay

## 2020-08-13 ENCOUNTER — Other Ambulatory Visit: Payer: Self-pay | Admitting: Endocrinology

## 2020-08-13 MED ORDER — EMPAGLIFLOZIN 10 MG PO TABS
ORAL_TABLET | Freq: Every day | ORAL | 3 refills | Status: DC
  Filled 2020-08-13: qty 30, fill #0
  Filled 2020-08-16: qty 90, 90d supply, fill #0

## 2020-08-16 ENCOUNTER — Other Ambulatory Visit (HOSPITAL_COMMUNITY): Payer: Self-pay

## 2020-08-16 DIAGNOSIS — H40013 Open angle with borderline findings, low risk, bilateral: Secondary | ICD-10-CM | POA: Diagnosis not present

## 2020-08-16 DIAGNOSIS — E119 Type 2 diabetes mellitus without complications: Secondary | ICD-10-CM | POA: Diagnosis not present

## 2020-08-16 DIAGNOSIS — H43813 Vitreous degeneration, bilateral: Secondary | ICD-10-CM | POA: Diagnosis not present

## 2020-08-16 DIAGNOSIS — H35373 Puckering of macula, bilateral: Secondary | ICD-10-CM | POA: Diagnosis not present

## 2020-08-17 ENCOUNTER — Other Ambulatory Visit (HOSPITAL_COMMUNITY): Payer: Self-pay

## 2020-08-17 MED ORDER — DONEPEZIL HCL 10 MG PO TABS
ORAL_TABLET | ORAL | 3 refills | Status: AC
  Filled 2020-08-18: qty 90, 90d supply, fill #0

## 2020-08-18 ENCOUNTER — Other Ambulatory Visit (HOSPITAL_COMMUNITY): Payer: Self-pay

## 2020-08-18 MED ORDER — PEG 3350-KCL-NA BICARB-NACL 420 G PO SOLR
ORAL | 0 refills | Status: AC
  Filled 2020-08-18 (×2): qty 4000, 1d supply, fill #0

## 2020-08-19 ENCOUNTER — Other Ambulatory Visit (HOSPITAL_COMMUNITY): Payer: Self-pay

## 2020-08-20 ENCOUNTER — Other Ambulatory Visit (HOSPITAL_COMMUNITY): Payer: Self-pay

## 2020-08-22 DIAGNOSIS — Z20822 Contact with and (suspected) exposure to covid-19: Secondary | ICD-10-CM | POA: Diagnosis not present

## 2020-08-23 DIAGNOSIS — J029 Acute pharyngitis, unspecified: Secondary | ICD-10-CM | POA: Diagnosis not present

## 2020-09-08 ENCOUNTER — Other Ambulatory Visit (HOSPITAL_COMMUNITY): Payer: Self-pay

## 2020-09-08 MED FILL — Rosuvastatin Calcium Tab 20 MG: ORAL | 90 days supply | Qty: 90 | Fill #0 | Status: AC

## 2020-09-13 DIAGNOSIS — Z86010 Personal history of colonic polyps: Secondary | ICD-10-CM | POA: Diagnosis not present

## 2020-09-13 DIAGNOSIS — K5730 Diverticulosis of large intestine without perforation or abscess without bleeding: Secondary | ICD-10-CM | POA: Diagnosis not present

## 2020-09-20 DIAGNOSIS — G319 Degenerative disease of nervous system, unspecified: Secondary | ICD-10-CM | POA: Diagnosis not present

## 2020-09-20 DIAGNOSIS — G4730 Sleep apnea, unspecified: Secondary | ICD-10-CM | POA: Diagnosis not present

## 2020-09-20 DIAGNOSIS — E785 Hyperlipidemia, unspecified: Secondary | ICD-10-CM | POA: Diagnosis not present

## 2020-09-20 DIAGNOSIS — E1149 Type 2 diabetes mellitus with other diabetic neurological complication: Secondary | ICD-10-CM | POA: Diagnosis not present

## 2020-09-20 DIAGNOSIS — G629 Polyneuropathy, unspecified: Secondary | ICD-10-CM | POA: Diagnosis not present

## 2020-09-20 DIAGNOSIS — G3184 Mild cognitive impairment, so stated: Secondary | ICD-10-CM | POA: Diagnosis not present

## 2020-09-30 ENCOUNTER — Other Ambulatory Visit: Payer: Self-pay | Admitting: Endocrinology

## 2020-09-30 ENCOUNTER — Other Ambulatory Visit (HOSPITAL_COMMUNITY): Payer: Self-pay

## 2020-09-30 ENCOUNTER — Telehealth: Payer: Self-pay | Admitting: Endocrinology

## 2020-09-30 MED ORDER — PIOGLITAZONE HCL 30 MG PO TABS
ORAL_TABLET | Freq: Every day | ORAL | 1 refills | Status: DC
  Filled 2020-09-30 – 2020-10-13 (×2): qty 90, 90d supply, fill #0
  Filled 2020-12-25: qty 90, 90d supply, fill #1

## 2020-09-30 MED FILL — Aspirin Chew Tab 81 MG: ORAL | 30 days supply | Qty: 30 | Fill #0 | Status: AC

## 2020-09-30 NOTE — Telephone Encounter (Signed)
Patient requesting a refill on RX Rx #: [id number]  pioglitazone (ACTOS) 30 MG tablet [[id number]] please advise

## 2020-10-01 ENCOUNTER — Other Ambulatory Visit (HOSPITAL_COMMUNITY): Payer: Self-pay

## 2020-10-08 ENCOUNTER — Other Ambulatory Visit (HOSPITAL_COMMUNITY): Payer: Self-pay

## 2020-10-12 ENCOUNTER — Other Ambulatory Visit (HOSPITAL_COMMUNITY): Payer: Self-pay

## 2020-10-13 ENCOUNTER — Other Ambulatory Visit (HOSPITAL_COMMUNITY): Payer: Self-pay

## 2020-10-19 ENCOUNTER — Other Ambulatory Visit: Payer: Self-pay | Admitting: Endocrinology

## 2020-10-19 ENCOUNTER — Other Ambulatory Visit (HOSPITAL_COMMUNITY): Payer: Self-pay

## 2020-10-19 MED ORDER — METFORMIN HCL 1000 MG PO TABS
1000.0000 mg | ORAL_TABLET | Freq: Two times a day (BID) | ORAL | 1 refills | Status: DC
Start: 2020-10-19 — End: 2021-04-19
  Filled 2020-10-19: qty 180, 90d supply, fill #0
  Filled 2021-01-18: qty 180, 90d supply, fill #1

## 2020-11-01 ENCOUNTER — Other Ambulatory Visit: Payer: 59

## 2020-11-02 DIAGNOSIS — G319 Degenerative disease of nervous system, unspecified: Secondary | ICD-10-CM | POA: Diagnosis not present

## 2020-11-02 DIAGNOSIS — Z79899 Other long term (current) drug therapy: Secondary | ICD-10-CM | POA: Diagnosis not present

## 2020-11-04 ENCOUNTER — Ambulatory Visit: Payer: 59 | Admitting: Endocrinology

## 2020-11-10 DIAGNOSIS — L309 Dermatitis, unspecified: Secondary | ICD-10-CM | POA: Diagnosis not present

## 2020-11-10 DIAGNOSIS — L814 Other melanin hyperpigmentation: Secondary | ICD-10-CM | POA: Diagnosis not present

## 2020-11-10 DIAGNOSIS — L578 Other skin changes due to chronic exposure to nonionizing radiation: Secondary | ICD-10-CM | POA: Diagnosis not present

## 2020-11-10 DIAGNOSIS — D225 Melanocytic nevi of trunk: Secondary | ICD-10-CM | POA: Diagnosis not present

## 2020-11-10 DIAGNOSIS — L821 Other seborrheic keratosis: Secondary | ICD-10-CM | POA: Diagnosis not present

## 2020-11-10 DIAGNOSIS — Z85828 Personal history of other malignant neoplasm of skin: Secondary | ICD-10-CM | POA: Diagnosis not present

## 2020-11-15 ENCOUNTER — Other Ambulatory Visit: Payer: Self-pay

## 2020-11-15 ENCOUNTER — Other Ambulatory Visit (INDEPENDENT_AMBULATORY_CARE_PROVIDER_SITE_OTHER): Payer: 59

## 2020-11-15 DIAGNOSIS — R42 Dizziness and giddiness: Secondary | ICD-10-CM

## 2020-11-15 DIAGNOSIS — E1165 Type 2 diabetes mellitus with hyperglycemia: Secondary | ICD-10-CM | POA: Diagnosis not present

## 2020-11-15 LAB — BASIC METABOLIC PANEL
BUN: 21 mg/dL (ref 6–23)
CO2: 26 mEq/L (ref 19–32)
Calcium: 9.9 mg/dL (ref 8.4–10.5)
Chloride: 102 mEq/L (ref 96–112)
Creatinine, Ser: 0.9 mg/dL (ref 0.40–1.50)
GFR: 89.06 mL/min (ref >60.00)
Glucose, Bld: 197 mg/dL — ABNORMAL HIGH (ref 70–99)
Potassium: 4 mEq/L (ref 3.5–5.1)
Sodium: 138 mEq/L (ref 135–145)

## 2020-11-15 LAB — HEMOGLOBIN A1C: Hgb A1c MFr Bld: 8.4 % — ABNORMAL HIGH (ref 4.6–6.5)

## 2020-11-16 LAB — CORTISOL: Cortisol, Plasma: 15.1 ug/dL

## 2020-11-17 ENCOUNTER — Other Ambulatory Visit (HOSPITAL_COMMUNITY): Payer: Self-pay

## 2020-11-17 MED FILL — Aspirin Chew Tab 81 MG: ORAL | 30 days supply | Qty: 30 | Fill #1 | Status: AC

## 2020-11-18 ENCOUNTER — Encounter: Payer: Self-pay | Admitting: Endocrinology

## 2020-11-18 ENCOUNTER — Ambulatory Visit: Payer: 59 | Admitting: Endocrinology

## 2020-11-18 ENCOUNTER — Other Ambulatory Visit: Payer: Self-pay

## 2020-11-18 ENCOUNTER — Other Ambulatory Visit (HOSPITAL_COMMUNITY): Payer: Self-pay

## 2020-11-18 VITALS — BP 100/64 | HR 63 | Ht 68.0 in | Wt 139.4 lb

## 2020-11-18 DIAGNOSIS — E1165 Type 2 diabetes mellitus with hyperglycemia: Secondary | ICD-10-CM

## 2020-11-18 MED ORDER — REPAGLINIDE 0.5 MG PO TABS
0.5000 mg | ORAL_TABLET | Freq: Three times a day (TID) | ORAL | 1 refills | Status: DC
Start: 2020-11-18 — End: 2021-02-07
  Filled 2020-11-18: qty 90, 30d supply, fill #0
  Filled 2020-12-25: qty 81, 27d supply, fill #1
  Filled 2020-12-25: qty 9, 3d supply, fill #1

## 2020-11-18 NOTE — Patient Instructions (Addendum)
Check blood sugars on waking up 2-3 days a week  Also check blood sugars about 2 hours after meals and do this after different meals by rotation  Recommended blood sugar levels on waking up are 90-130 and about 2 hours after meal is 130-160  Please bring your blood sugar monitor to each visit, thank you  Take 1-2 Repaglanide at start of meals with carbs, likely need 2 at breakfast

## 2020-11-18 NOTE — Progress Notes (Signed)
Patient ID: Joshua Lamb, male   DOB: [birth year], 65 y.o.   MRN: [id number]           Reason for Appointment: Follow-up for Type 2 Diabetes  Referring PCP: Marton Redwood   History of Present Illness:          Date of diagnosis of type 2 diabetes mellitus:   2017    Background history:  He was diagnosed when he had blood sugars above normal without any symptoms He believes his highest A1c was 7.8 Initially was treated with Metformin and apparently because of inadequate level of control he was given 25 mg Jardiance also probably in 2017 Apparently his weight prior to starting Jardiance was 190 pounds He progressively lost weight with this and his A1c has been between 6.3 and 7.3 since 2020  Recent history:   Most recent A1c is 8.4 compared to 7.3 Fructosamine last 280  Non-insulin hypoglycemic drugs the patient is taking are: Metformin 1 g twice daily, Jardiance 10 mg daily, Actos 30 mg daily  Current management, blood sugar patterns and problems identified: He is appearing to have relatively high fasting readings compared to last visit  However has only very sporadic monitoring after meals  Since he was out of town he did not check his blood sugar over the last 3 weeks  His lab glucose was 197 after breakfast  He is still eating a relatively larger meal at breakfast with bread but may or may not get much carbohydrate at the other meals, sometimes having salads Not clear why his weight has gone down Overall has not exercised regularly when he was out of town but starting to get back into his biking again Highest nonfasting blood sugar at home was 180        Side effects from medications have been: None     Meal times are:  Breakfast is at 7 AM, dinner 6:30 PM  Typical meal intake: Breakfast is   toast with eggs and sausage or oatmeal with 4 ounces of juice.  Dinner usually salad, vegetables and protein.  Snacks apple or peanuts     Only occasionally will go to fast food  restaurants          Glucose monitoring:  done <1 times a day         Glucometer:  Freestyle lite     Blood Glucose readings by download of meter   PRE-MEAL Fasting Lunch Dinner Bedtime Overall  Glucose range: 133-181 91     Mean/median: 145    147   POST-MEAL PC Breakfast PC Lunch PC Dinner  Glucose range: 161 180 131-164  Mean/median:      Previously:  PRE-MEAL Fasting Lunch Dinner Bedtime Overall  Glucose range:  112-154   95    Mean/median:  129     135   POST-MEAL PC Breakfast PC Lunch PC Dinner  Glucose range:  216    Mean/median:        Dietician visit, most recent: 2017  Weight history:  Wt Readings from Last 3 Encounters:  11/18/20 139 lb 6.4 oz (63.2 kg)  07/28/20 144 lb (65.3 kg)  04/28/20 140 lb (63.5 kg)    Glycemic control:   Lab Results  Component Value Date   HGBA1C 8.4 (H) 11/15/2020   HGBA1C 7.3 (H) 07/22/2020   HGBA1C 7.1 (H) 04/21/2020   Lab Results  Component Value Date   MICROALBUR 24 08/14/2019   LDLCALC 70 08/07/2019   CREATININE  0.90 11/15/2020   No results found for: [facility]  Lab Results  Component Value Date   FRUCTOSAMINE 280 02/25/2020    Lab on 11/15/2020  Component Date Value Ref Range Status   Cortisol, Plasma 11/15/2020 15.1  ug/dL Final   AM:  4.3 - 22.4 ug/dLPM:  3.1 - 16.7 ug/dL   Sodium 11/15/2020 138  135 - 145 mEq/L Final   Potassium 11/15/2020 4.0  3.5 - 5.1 mEq/L Final   Chloride 11/15/2020 102  96 - 112 mEq/L Final   CO2 11/15/2020 26  19 - 32 mEq/L Final   Glucose, Bld 11/15/2020 197 (A) 70 - 99 mg/dL Final   BUN 11/15/2020 21  6 - 23 mg/dL Final   Creatinine, Ser 11/15/2020 0.90  0.40 - 1.50 mg/dL Final   GFR 11/15/2020 89.06  >60.00 mL/min Final   Calculated using the CKD-EPI Creatinine Equation (2021)   Calcium 11/15/2020 9.9  8.4 - 10.5 mg/dL Final   Hgb A1c MFr Bld 11/15/2020 8.4 (A) 4.6 - 6.5 % Final   Glycemic Control Guidelines for People with Diabetes:Non Diabetic:  <6%Goal of Therapy:  <7%Additional Action Suggested:  >8%     Allergies as of 11/18/2020   No Known Allergies      Medication List        Accurate as of November 18, 2020 10:41 AM. If you have any questions, ask your nurse or doctor.          acetaminophen 500 MG tablet Commonly known as: TYLENOL Take 1,000 mg by mouth every 6 (six) hours as needed for moderate pain.   Aspirin Low Dose 81 MG chewable tablet Generic drug: aspirin CHEW 1 TABLET BY MOUTH ONCE A DAY   donepezil 5 MG tablet Commonly known as: ARICEPT TAKE 1/2 TABLET BY MOUTH EVERY MORNING WITH BREAKFAST FOR 1 MONTH. THEN TAKE 1 WHOLE TABLET DAILY WITH BREAKFAST.   donepezil 10 MG tablet Commonly known as: ARICEPT Take 1 tablet every morning after breakfast   freestyle lancets USE TO CHECK BLOOD SUGAR ONCE DAILY   FREESTYLE LITE test strip Generic drug: glucose blood USE TO CHECK BLOOD SUGAR ONCE DAILY.   FREESTYLE LITE test strip Generic drug: glucose blood 1 each by Other route as needed for other. Use as instructed to check blood sugar once a day dx code E11.65   Jardiance 10 MG Tabs tablet Generic drug: empagliflozin TAKE 1 TABLET BY MOUTH ONCE DAILY WITH BREAKFAST   metFORMIN 1000 MG tablet Commonly known as: GLUCOPHAGE Take 1 tablet (1,000 mg total) by mouth 2 (two) times daily with a meal.   pantoprazole 40 MG tablet Commonly known as: PROTONIX Take 40 mg by mouth daily as needed (for acid reflux).   pioglitazone 30 MG tablet Commonly known as: ACTOS TAKE 1 TABLET BY MOUTH ONCE A DAY   polyethylene glycol-electrolytes 420 g solution Commonly known as: NuLYTELY Use as directed   rosuvastatin 20 MG tablet Commonly known as: CRESTOR TAKE 1 TABLET BY MOUTH ONCE A DAY        Allergies: No Known Allergies  Past Medical History:  Diagnosis Date   Arthritis    Diabetes mellitus without complication (HCC)    GERD (gastroesophageal reflux disease)    High cholesterol     Past Surgical History:   Procedure Laterality Date   CLEFT LIP REPAIR     CLEFT PALATE REPAIR      Family History  Problem Relation Age of Onset   Diabetes Father  Heart disease Neg Hx     Social History:  reports that he has never smoked. He has never used smokeless tobacco. He reports current alcohol use. He reports that he does not use drugs.   Review of Systems   Lipid history: Lipids controlled with 20 mg Crestor, followed by PCP    Lab Results  Component Value Date   LDLCALC 70 08/07/2019   TRIG 62 08/07/2019           Blood pressure history:  Usually has low normal readings Serum cortisol checked for his low normal blood pressure reading was normal midmorning at 15  BP Readings from Last 3 Encounters:  11/18/20 100/64  07/28/20 105/65  04/28/20 106/62    Most recent eye exam was in 9/21  Most recent foot exam: 11/21  Currently known complications of diabetes: None  LABS:  Lab on 11/15/2020  Component Date Value Ref Range Status   Cortisol, Plasma 11/15/2020 15.1  ug/dL Final   AM:  4.3 - 22.4 ug/dLPM:  3.1 - 16.7 ug/dL   Sodium 11/15/2020 138  135 - 145 mEq/L Final   Potassium 11/15/2020 4.0  3.5 - 5.1 mEq/L Final   Chloride 11/15/2020 102  96 - 112 mEq/L Final   CO2 11/15/2020 26  19 - 32 mEq/L Final   Glucose, Bld 11/15/2020 197 (A) 70 - 99 mg/dL Final   BUN 11/15/2020 21  6 - 23 mg/dL Final   Creatinine, Ser 11/15/2020 0.90  0.40 - 1.50 mg/dL Final   GFR 11/15/2020 89.06  >60.00 mL/min Final   Calculated using the CKD-EPI Creatinine Equation (2021)   Calcium 11/15/2020 9.9  8.4 - 10.5 mg/dL Final   Hgb A1c MFr Bld 11/15/2020 8.4 (A) 4.6 - 6.5 % Final   Glycemic Control Guidelines for People with Diabetes:Non Diabetic:  <6%Goal of Therapy: <7%Additional Action Suggested:  >8%     Physical Examination:  BP 100/64   Pulse 63   Ht '5\' 8"'$  (1.727 m)   Wt 139 lb 6.4 oz (63.2 kg)   SpO2 96%   BMI 21.20 kg/m       ASSESSMENT:  Diabetes type 2  non-insulin-dependent  See history of present illness for detailed discussion of current diabetes management, blood sugar patterns and problems identified  A1c is now 8.4 and higher than usual  He is on Metformin 1 g twice daily and Jardiance 10 mg as well as Actos 30 mg Without adequate glucose monitoring not clear how often he is having high blood sugars but overall appears to have increased hyperglycemia in both fasting and after meals Generally appears to be having some insulin deficiency Because of his tendency to weight loss he is not a good candidate for GLP-1 drugs Also may benefit from more regular exercise   PLAN:    He needs to start checking his blood sugars regularly again at various times  Repaglinide 0.5 mg, 1 to 2 tablets with most meals except when he is eating only salads or other low carbohydrate meals Prescription for Prandin sent and patient information on Prandin given  Discussed postprandial blood sugar targets Will continue other medications including Actos for now If not benefiting may still consider trying low-dose Rybelsus  There are no Patient Instructions on file for this visit.     Elayne Snare 11/18/2020, 10:41 AM   Note: This office note was prepared with Dragon voice recognition system technology. Any transcriptional errors that result from this process are unintentional.

## 2020-11-19 ENCOUNTER — Other Ambulatory Visit (HOSPITAL_COMMUNITY): Payer: Self-pay

## 2020-11-22 ENCOUNTER — Other Ambulatory Visit: Payer: Self-pay | Admitting: Endocrinology

## 2020-11-22 ENCOUNTER — Other Ambulatory Visit (HOSPITAL_COMMUNITY): Payer: Self-pay

## 2020-11-22 MED ORDER — EMPAGLIFLOZIN 10 MG PO TABS
ORAL_TABLET | Freq: Every day | ORAL | 1 refills | Status: DC
  Filled 2020-11-22: qty 90, 90d supply, fill #0
  Filled 2021-02-22: qty 90, 90d supply, fill #1

## 2020-11-22 MED FILL — Glucose Blood Test Strip: 90 days supply | Qty: 100 | Fill #1 | Status: AC

## 2020-11-23 ENCOUNTER — Other Ambulatory Visit (HOSPITAL_COMMUNITY): Payer: Self-pay

## 2020-11-23 MED ORDER — CARESTART COVID-19 HOME TEST VI KIT
PACK | 0 refills | Status: AC
  Filled 2020-11-23: qty 4, 4d supply, fill #0

## 2020-11-29 DIAGNOSIS — G319 Degenerative disease of nervous system, unspecified: Secondary | ICD-10-CM | POA: Diagnosis not present

## 2020-12-01 ENCOUNTER — Other Ambulatory Visit (HOSPITAL_COMMUNITY): Payer: Self-pay

## 2020-12-01 MED ORDER — DONEPEZIL HCL 23 MG PO TABS
23.0000 mg | ORAL_TABLET | Freq: Every day | ORAL | 3 refills | Status: AC
  Filled 2020-12-01: qty 90, 90d supply, fill #0
  Filled 2021-03-05: qty 90, 90d supply, fill #1
  Filled 2021-04-21: qty 90, 90d supply, fill #2

## 2020-12-02 ENCOUNTER — Other Ambulatory Visit (HOSPITAL_COMMUNITY): Payer: Self-pay

## 2020-12-13 ENCOUNTER — Other Ambulatory Visit (HOSPITAL_COMMUNITY): Payer: Self-pay

## 2020-12-13 MED FILL — Rosuvastatin Calcium Tab 20 MG: ORAL | 90 days supply | Qty: 90 | Fill #1 | Status: AC

## 2020-12-20 DIAGNOSIS — H5213 Myopia, bilateral: Secondary | ICD-10-CM | POA: Diagnosis not present

## 2020-12-20 DIAGNOSIS — H5340 Unspecified visual field defects: Secondary | ICD-10-CM | POA: Diagnosis not present

## 2020-12-20 DIAGNOSIS — H524 Presbyopia: Secondary | ICD-10-CM | POA: Diagnosis not present

## 2020-12-25 ENCOUNTER — Other Ambulatory Visit (HOSPITAL_COMMUNITY): Payer: Self-pay

## 2020-12-27 ENCOUNTER — Other Ambulatory Visit (HOSPITAL_COMMUNITY): Payer: Self-pay

## 2020-12-28 ENCOUNTER — Other Ambulatory Visit (HOSPITAL_COMMUNITY): Payer: Self-pay

## 2021-01-03 ENCOUNTER — Other Ambulatory Visit: Payer: Self-pay

## 2021-01-03 ENCOUNTER — Other Ambulatory Visit (HOSPITAL_BASED_OUTPATIENT_CLINIC_OR_DEPARTMENT_OTHER): Payer: Self-pay

## 2021-01-03 ENCOUNTER — Ambulatory Visit: Payer: 59 | Attending: Internal Medicine

## 2021-01-03 DIAGNOSIS — Z23 Encounter for immunization: Secondary | ICD-10-CM

## 2021-01-03 MED ORDER — PFIZER COVID-19 VAC BIVALENT 30 MCG/0.3ML IM SUSP
INTRAMUSCULAR | 0 refills | Status: AC
  Filled 2021-01-03: qty 0.3, 1d supply, fill #0

## 2021-01-03 MED ORDER — FLUAD QUADRIVALENT 0.5 ML IM PRSY
PREFILLED_SYRINGE | INTRAMUSCULAR | 0 refills | Status: AC
  Filled 2021-01-03: qty 0.5, 1d supply, fill #0

## 2021-01-03 NOTE — Progress Notes (Signed)
   Covid-19 Vaccination Clinic  Name:  Joshua Lamb    MRN: [id number] DOB: January 04, 1956  01/03/2021  [name] was observed post Covid-19 immunization for 15 minutes without incident. He was provided with Vaccine Information Sheet and instruction to access the V-Safe system.   [name] was instructed to call 911 with any severe reactions post vaccine: Difficulty breathing  Swelling of face and throat  A fast heartbeat  A bad rash all over body  Dizziness and weakness

## 2021-01-06 ENCOUNTER — Other Ambulatory Visit: Payer: Self-pay

## 2021-01-06 ENCOUNTER — Other Ambulatory Visit (INDEPENDENT_AMBULATORY_CARE_PROVIDER_SITE_OTHER): Payer: 59

## 2021-01-06 DIAGNOSIS — E1165 Type 2 diabetes mellitus with hyperglycemia: Secondary | ICD-10-CM | POA: Diagnosis not present

## 2021-01-06 LAB — BASIC METABOLIC PANEL
BUN: 23 mg/dL (ref 6–23)
CO2: 28 mEq/L (ref 19–32)
Calcium: 9.6 mg/dL (ref 8.4–10.5)
Chloride: 100 mEq/L (ref 96–112)
Creatinine, Ser: 0.84 mg/dL (ref 0.40–1.50)
GFR: 90.85 mL/min (ref >60.00)
Glucose, Bld: 106 mg/dL — ABNORMAL HIGH (ref 70–99)
Potassium: 4.1 mEq/L (ref 3.5–5.1)
Sodium: 137 mEq/L (ref 135–145)

## 2021-01-07 LAB — FRUCTOSAMINE: Fructosamine: 332 umol/L — ABNORMAL HIGH (ref 0–285)

## 2021-01-10 ENCOUNTER — Ambulatory Visit: Payer: 59 | Admitting: Endocrinology

## 2021-01-10 ENCOUNTER — Encounter: Payer: Self-pay | Admitting: Endocrinology

## 2021-01-10 ENCOUNTER — Other Ambulatory Visit: Payer: Self-pay

## 2021-01-10 ENCOUNTER — Other Ambulatory Visit (HOSPITAL_COMMUNITY): Payer: Self-pay

## 2021-01-10 VITALS — BP 112/70 | HR 52 | Ht 68.0 in | Wt 144.0 lb

## 2021-01-10 DIAGNOSIS — E1165 Type 2 diabetes mellitus with hyperglycemia: Secondary | ICD-10-CM

## 2021-01-10 MED ORDER — ONETOUCH DELICA LANCETS 33G MISC
2 refills | Status: AC
  Filled 2021-01-10: qty 100, fill #0

## 2021-01-10 MED ORDER — FREESTYLE LIBRE 2 SENSOR MISC
3 refills | Status: DC
  Filled 2021-01-10: qty 2, 28d supply, fill #0
  Filled 2021-02-23: qty 2, 28d supply, fill #1
  Filled 2021-03-24: qty 2, 28d supply, fill #2
  Filled 2021-04-11: qty 1, 14d supply, fill #3
  Filled 2021-05-25: qty 1, 14d supply, fill #4

## 2021-01-10 MED ORDER — GLUCOSE BLOOD VI STRP
ORAL_STRIP | 12 refills | Status: AC
  Filled 2021-01-10: qty 100, fill #0

## 2021-01-10 MED ORDER — ONETOUCH VERIO REFLECT W/DEVICE KIT
PACK | 0 refills | Status: AC
  Filled 2021-01-10: qty 1, 30d supply, fill #0

## 2021-01-10 NOTE — Patient Instructions (Addendum)
Take 2 Prandin before Bfsat and lunch  Check blood sugars on waking up 2-3 days a week  Also check blood sugars about 2 hours after meals and do this after different meals by rotation  Recommended blood sugar levels on waking up are 90-130 and about 2 hours after meal is 130-160  Please bring your blood sugar monitor to each visit, thank you

## 2021-01-10 NOTE — Progress Notes (Signed)
Patient ID: Joshua Lamb, male   DOB: 1955-04-22, 65 y.o.   MRN: [id number]           Reason for Appointment: Follow-up for Type 2 Diabetes  Referring PCP: Marton Redwood   History of Present Illness:          Date of diagnosis of type 2 diabetes mellitus:   2017    Background history:  He was diagnosed when he had blood sugars above normal without any symptoms He believes his highest A1c was 7.8 Initially was treated with Metformin and apparently because of inadequate level of control he was given 25 mg Jardiance also probably in 2017 Apparently his weight prior to starting Jardiance was 190 pounds He progressively lost weight with this and his A1c has been between 6.3 and 7.3 since 2020  Recent history:   Most recent A1c is 8.4 compared to 7.3 Fructosamine 332 compared to 280  Non-insulin hypoglycemic drugs the patient is taking are: Metformin 1 g twice daily, Jardiance 10 mg daily, Actos 30 mg daily, Prandin 0.5 mg 3 times daily  Current management, blood sugar patterns and problems identified: Previously was having relatively high fasting readings now his fasting readings at home are variable  His wife is present today and she thinks that he is eating relatively low carbohydrate and low-fat meals at dinnertime  Most of his carbohydrate intake is at lunch when he is eating 2 sandwiches and at breakfast only 2 slices of bread Also not a lot of snacks except some peanuts Usually not eating late at night at bedtime and trying to eat supper around 6:00 He was told to take [facility] before meals but he tends to forget to do so Again has only very sporadic monitoring after meals and on his phone and has only 1 reading at night of 133 at night, he says he tends to forget to check 2 hours after eating Since he has difficulty getting the meter to work after pricking his finger he has not done as many readings His wife has a number of questions about monitoring and diet as well as  concerned about trying to make him gain weight His lab glucose was 106 fasting He is to have relatively higher fat intake at breakfast with 3 pieces of sausage Overall is doing fairly well with physical activities for exercise        Side effects from medications have been: None     Meal times are:  Breakfast is at 7 AM, dinner 6:30 PM  Typical meal intake: Breakfast is   toast with eggs and sausage with 4 ounces of juice.  Dinner usually salad, vegetables and protein.  Snacks apple or peanuts     Only occasionally will go to fast food restaurants          Glucose monitoring:  done <1 times a day         Glucometer:  Freestyle lite     Blood Glucose readings    PRE-MEAL Fasting Lunch Dinner Bedtime Overall  Glucose range: 113-154   133   Mean/median:        POST-MEAL PC Breakfast PC Lunch PC Dinner  Glucose range:   ?  Mean/median:       Prior   PRE-MEAL Fasting Lunch Dinner Bedtime Overall  Glucose range: 133-181 91     Mean/median: 145    147   POST-MEAL PC Breakfast PC Lunch PC Dinner  Glucose range: 161 180 131-164  Mean/median:  Dietician visit, most recent: 2017  Weight history:  Wt Readings from Last 3 Encounters:  01/10/21 144 lb (65.3 kg)  11/18/20 139 lb 6.4 oz (63.2 kg)  07/28/20 144 lb (65.3 kg)    Glycemic control:   Lab Results  Component Value Date   HGBA1C 8.4 (H) 11/15/2020   HGBA1C 7.3 (H) 07/22/2020   HGBA1C 7.1 (H) 04/21/2020   Lab Results  Component Value Date   MICROALBUR 24 08/14/2019   LDLCALC 70 08/07/2019   CREATININE 0.84 01/06/2021   No results found for: [facility]  Lab Results  Component Value Date   FRUCTOSAMINE 332 (H) 01/06/2021   FRUCTOSAMINE 280 02/25/2020    Lab on 01/06/2021  Component Date Value Ref Range Status   Fructosamine 01/06/2021 332 (A) 0 - 285 umol/L Final   Comment: Published reference interval for apparently healthy subjects between age 20 and 108 is 34 - 285 umol/L and in a  poorly controlled diabetic population is 228 - 563 umol/L with a mean of 396 umol/L.    Sodium 01/06/2021 137  135 - 145 mEq/L Final   Potassium 01/06/2021 4.1  3.5 - 5.1 mEq/L Final   Chloride 01/06/2021 100  96 - 112 mEq/L Final   CO2 01/06/2021 28  19 - 32 mEq/L Final   Glucose, Bld 01/06/2021 106 (A) 70 - 99 mg/dL Final   BUN 01/06/2021 23  6 - 23 mg/dL Final   Creatinine, Ser 01/06/2021 0.84  0.40 - 1.50 mg/dL Final   GFR 01/06/2021 90.85  >60.00 mL/min Final   Calculated using the CKD-EPI Creatinine Equation (2021)   Calcium 01/06/2021 9.6  8.4 - 10.5 mg/dL Final    Allergies as of 01/10/2021   No Known Allergies      Medication List        Accurate as of January 10, 2021  1:41 PM. If you have any questions, ask your nurse or doctor.          acetaminophen 500 MG tablet Commonly known as: TYLENOL Take 1,000 mg by mouth every 6 (six) hours as needed for moderate pain.   Aspirin Low Dose 81 MG chewable tablet Generic drug: aspirin CHEW 1 TABLET BY MOUTH ONCE A DAY   Carestart COVID-19 Home Test Kit Generic drug: COVID-19 At Home Antigen Test Use as directed   donepezil 10 MG tablet Commonly known as: ARICEPT Take 1 tablet every morning after breakfast What changed: Another medication with the same name was removed. Continue taking this medication, and follow the directions you see here. Changed by: Elayne Snare, MD   donepezil 23 MG Tabs tablet Commonly known as: ARICEPT Take 1 tablet by mouth daily. What changed: Another medication with the same name was removed. Continue taking this medication, and follow the directions you see here. Changed by: Elayne Snare, MD   Fluad Quadrivalent 0.5 ML injection Generic drug: influenza vaccine adjuvanted Inject into the muscle.   freestyle lancets USE TO CHECK BLOOD SUGAR ONCE DAILY   FREESTYLE LITE test strip Generic drug: glucose blood USE TO CHECK BLOOD SUGAR ONCE DAILY.   FREESTYLE LITE test strip Generic  drug: glucose blood 1 each by Other route as needed for other. Use as instructed to check blood sugar once a day dx code E11.65   Jardiance 10 MG Tabs tablet Generic drug: empagliflozin TAKE 1 TABLET BY MOUTH ONCE DAILY WITH BREAKFAST   metFORMIN 1000 MG tablet Commonly known as: GLUCOPHAGE Take 1 tablet (1,000 mg total) by mouth 2 (  two) times daily with a meal.   pantoprazole 40 MG tablet Commonly known as: PROTONIX Take 40 mg by mouth daily as needed (for acid reflux).   Pfizer COVID-19 Vac Bivalent injection Generic drug: COVID-19 mRNA bivalent vaccine Therapist, music) Inject into the muscle.   pioglitazone 30 MG tablet Commonly known as: ACTOS TAKE 1 TABLET BY MOUTH ONCE A DAY   polyethylene glycol-electrolytes 420 g solution Commonly known as: NuLYTELY Use as directed   repaglinide 0.5 MG tablet Commonly known as: PRANDIN Take 1 tablet by mouth 3 times daily before meals.   rosuvastatin 20 MG tablet Commonly known as: CRESTOR TAKE 1 TABLET BY MOUTH ONCE A DAY        Allergies: No Known Allergies  Past Medical History:  Diagnosis Date   Arthritis    Diabetes mellitus without complication (HCC)    GERD (gastroesophageal reflux disease)    High cholesterol     Past Surgical History:  Procedure Laterality Date   CLEFT LIP REPAIR     CLEFT PALATE REPAIR      Family History  Problem Relation Age of Onset   Diabetes Father    Heart disease Neg Hx     Social History:  reports that he has never smoked. He has never used smokeless tobacco. He reports current alcohol use. He reports that he does not use drugs.   Review of Systems   Lipid history: Lipids controlled with 20 mg Crestor, followed by PCP    Lab Results  Component Value Date   LDLCALC 70 08/07/2019   TRIG 62 08/07/2019           Blood pressure history:  Usually has low normal readings Serum cortisol checked for his low normal blood pressure reading was normal midmorning at 15  BP Readings  from Last 3 Encounters:  01/10/21 112/70  11/18/20 100/64  07/28/20 105/65    Most recent eye exam was in 9/21  Most recent foot exam: 11/21  Currently known complications of diabetes: None  LABS:  Lab on 01/06/2021  Component Date Value Ref Range Status   Fructosamine 01/06/2021 332 (A) 0 - 285 umol/L Final   Comment: Published reference interval for apparently healthy subjects between age 48 and 34 is 36 - 285 umol/L and in a poorly controlled diabetic population is 228 - 563 umol/L with a mean of 396 umol/L.    Sodium 01/06/2021 137  135 - 145 mEq/L Final   Potassium 01/06/2021 4.1  3.5 - 5.1 mEq/L Final   Chloride 01/06/2021 100  96 - 112 mEq/L Final   CO2 01/06/2021 28  19 - 32 mEq/L Final   Glucose, Bld 01/06/2021 106 (A) 70 - 99 mg/dL Final   BUN 01/06/2021 23  6 - 23 mg/dL Final   Creatinine, Ser 01/06/2021 0.84  0.40 - 1.50 mg/dL Final   GFR 01/06/2021 90.85  >60.00 mL/min Final   Calculated using the CKD-EPI Creatinine Equation (2021)   Calcium 01/06/2021 9.6  8.4 - 10.5 mg/dL Final    Physical Examination:  BP 112/70   Pulse (!) 52   Ht '5\' 8"'  (1.727 m)   Wt 144 lb (65.3 kg)   SpO2 99%   BMI 21.90 kg/m       ASSESSMENT:  Diabetes type 2 non-insulin-dependent  See history of present illness for detailed discussion of current diabetes management, blood sugar patterns and problems identified  A1c is last 8.4 and fructosamine of 332 was also higher than usual  He  is on Metformin 1 g twice daily, Prandin 0.5 mg AC 3 times daily and Jardiance 10 mg as well as Actos 30 mg  As discussed above he has difficulty getting adequate blood sugar monitoring especially postprandial Likely has high readings after lunch and breakfast when he has much more carbohydrate  Also not taking Prandin Premeal as directed usually after 12:30 PM  Discussed in detail the effects of diet, dawn phenomenon, insulin deficiency especially mealtime insulin requirement and effects of  Prandin and timing of taking this Has not been given GLP-1 because of potential for weight loss   PLAN:    He needs to start checking his blood sugars midmorning and midafternoon and does not have to be exactly 2 hours later Likely needs to take 2 tablets at breakfast and lunch  He can take this up to 30-minute before eating He was given a sample of the freestyle libre and also coupon to get the One Touch Verio monitor Discussed how the freestyle libre sensor works as well as how often to check  If his insurance is able to cover his monitor test strips and also the Homer he can switch from the freestyle meter  To call if he has consistently high readings over 200 If not benefiting may still consider trying low-dose Rybelsus or mealtime insulin  Consultation with diabetes educators and also needs to discuss meal planning in detail  Total visit time including counseling = 30 minutes  Patient Instructions  Take 2 Prandin before Bfsat and lunch     Elayne Snare 01/10/2021, 1:41 PM   Note: This office note was prepared with Dragon voice recognition system technology. Any transcriptional errors that result from this process are unintentional.

## 2021-01-11 ENCOUNTER — Other Ambulatory Visit (HOSPITAL_COMMUNITY): Payer: Self-pay

## 2021-01-19 ENCOUNTER — Other Ambulatory Visit (HOSPITAL_COMMUNITY): Payer: Self-pay

## 2021-02-07 ENCOUNTER — Other Ambulatory Visit: Payer: Self-pay | Admitting: Endocrinology

## 2021-02-07 ENCOUNTER — Other Ambulatory Visit (HOSPITAL_COMMUNITY): Payer: Self-pay

## 2021-02-07 MED ORDER — REPAGLINIDE 0.5 MG PO TABS
ORAL_TABLET | ORAL | 1 refills | Status: DC
  Filled 2021-02-07: qty 90, 23d supply, fill #0
  Filled 2021-03-05: qty 90, 23d supply, fill #1

## 2021-02-08 ENCOUNTER — Telehealth: Payer: Self-pay

## 2021-02-08 ENCOUNTER — Other Ambulatory Visit (HOSPITAL_COMMUNITY): Payer: Self-pay

## 2021-02-08 NOTE — Telephone Encounter (Signed)
Received call back from his wife Webb Silversmith, he is at work, offered next [employer] class on 11/15 at 2pm, but not sure he can make it. Asked her to have him call me at his convenience to discus scheduling.

## 2021-02-08 NOTE — Telephone Encounter (Signed)
Called to discuss PREP program, left voicemail ? ?

## 2021-02-09 ENCOUNTER — Other Ambulatory Visit (HOSPITAL_COMMUNITY): Payer: Self-pay

## 2021-02-09 ENCOUNTER — Telehealth: Payer: Self-pay | Admitting: Endocrinology

## 2021-02-09 NOTE — Telephone Encounter (Signed)
Pt has Freestyle Libre 2 and needs assistance to send info to [employer]. Email is [email or web address]  Pt phone contact [phone]

## 2021-02-11 NOTE — Telephone Encounter (Signed)
Sent invitation and patient is connected

## 2021-02-14 DIAGNOSIS — H9012 Conductive hearing loss, unilateral, left ear, with unrestricted hearing on the contralateral side: Secondary | ICD-10-CM | POA: Diagnosis not present

## 2021-02-22 ENCOUNTER — Other Ambulatory Visit (HOSPITAL_COMMUNITY): Payer: Self-pay

## 2021-02-23 ENCOUNTER — Other Ambulatory Visit (HOSPITAL_COMMUNITY): Payer: Self-pay

## 2021-02-23 DIAGNOSIS — E1149 Type 2 diabetes mellitus with other diabetic neurological complication: Secondary | ICD-10-CM | POA: Diagnosis not present

## 2021-02-23 DIAGNOSIS — Z125 Encounter for screening for malignant neoplasm of prostate: Secondary | ICD-10-CM | POA: Diagnosis not present

## 2021-02-23 DIAGNOSIS — E785 Hyperlipidemia, unspecified: Secondary | ICD-10-CM | POA: Diagnosis not present

## 2021-02-23 LAB — BASIC METABOLIC PANEL
Creatinine: 0.9 (ref 0.6–1.3)
Glucose: 128

## 2021-02-23 LAB — LIPID PANEL: LDL Cholesterol: 92

## 2021-02-23 LAB — HEMOGLOBIN A1C: Hemoglobin A1C: 6.8

## 2021-03-05 ENCOUNTER — Other Ambulatory Visit (HOSPITAL_COMMUNITY): Payer: Self-pay

## 2021-03-05 MED FILL — Rosuvastatin Calcium Tab 20 MG: ORAL | 90 days supply | Qty: 90 | Fill #2 | Status: AC

## 2021-03-07 ENCOUNTER — Other Ambulatory Visit (HOSPITAL_COMMUNITY): Payer: Self-pay

## 2021-03-08 ENCOUNTER — Other Ambulatory Visit (HOSPITAL_COMMUNITY): Payer: Self-pay

## 2021-03-08 DIAGNOSIS — E1149 Type 2 diabetes mellitus with other diabetic neurological complication: Secondary | ICD-10-CM | POA: Diagnosis not present

## 2021-03-08 DIAGNOSIS — R82998 Other abnormal findings in urine: Secondary | ICD-10-CM | POA: Diagnosis not present

## 2021-03-09 ENCOUNTER — Other Ambulatory Visit: Payer: 59

## 2021-03-10 ENCOUNTER — Encounter: Payer: Self-pay | Admitting: Endocrinology

## 2021-03-14 ENCOUNTER — Encounter: Payer: Self-pay | Admitting: Endocrinology

## 2021-03-14 ENCOUNTER — Other Ambulatory Visit: Payer: Self-pay

## 2021-03-14 ENCOUNTER — Ambulatory Visit: Payer: 59 | Admitting: Endocrinology

## 2021-03-14 VITALS — BP 114/64 | HR 68 | Ht 68.0 in | Wt 142.8 lb

## 2021-03-14 DIAGNOSIS — E1165 Type 2 diabetes mellitus with hyperglycemia: Secondary | ICD-10-CM | POA: Diagnosis not present

## 2021-03-14 DIAGNOSIS — R634 Abnormal weight loss: Secondary | ICD-10-CM | POA: Diagnosis not present

## 2021-03-14 NOTE — Progress Notes (Signed)
Patient ID: Joshua Lamb, male   DOB: 1956/03/21, 65 y.o.   MRN: [id number]           Reason for Appointment: Follow-up for Type 2 Diabetes  Referring PCP: Marton Redwood   History of Present Illness:          Date of diagnosis of type 2 diabetes mellitus:   2017    Background history:  He was diagnosed when he had blood sugars above normal without any symptoms He believes his highest A1c was 7.8 Initially was treated with Metformin and apparently because of inadequate level of control he was given 25 mg Jardiance also probably in 2017 Apparently his weight prior to starting Jardiance was 190 pounds He progressively lost weight with this and his A1c has been between 6.3 and 7.3 since 2020  Recent history:   Most recent A1c is 6.8 compared to 8.4 in August  Non-insulin hypoglycemic drugs: Metformin 1 g twice daily, Jardiance 10 mg daily, Actos 30 mg daily, Prandin 0.5 mg 3 times daily  Current management, blood sugar patterns and problems identified: He has been using the freestyle libre to monitor his blood sugars for about 3 months  His wife thinks that this has helped his blood sugar control  Although he is still mostly trying to limit his carbohydrate intake especially at dinnertime he appears to be getting higher readings with any high fat meals  On an average however his blood sugar control is excellent with 87% of readings within the target range He was told to take [facility] before meals but he tends to forget; also has not understood how to take it He thinks he needs to take it only with high carbohydrate meals and since this is not typical he will not take it He tends to have sporadic high blood sugar episodes after breakfast and lunch but only occasionally after dinner Although he thinks his FASTING readings are higher than the night before his blood sugar patterns show stable readings around 130+ between 10 PM and 6 AM Weight is down 2 pounds No edema with Actos His  lab glucose was 128 with his PCP fasting He may have relatively higher fat intake at breakfast such as having 3 pieces of sausage Overall is doing fairly well with various physical activities for exercise including biking        Side effects from medications have been: None     Meal times are:  Breakfast is at 7 AM, dinner 6:30 PM  Typical meal intake: Breakfast is   toast with eggs and sausage with 4 ounces of juice.  Dinner usually salad, vegetables and protein.  Snacks apple or peanuts     Only occasionally will go to fast food restaurants          Glucose monitoring:  done <1 times a day         Glucometer:  Freestyle libre  CGM use % of time 80  2-week average/GV 144  Time in range 87       %  % Time Above 180 11+2  % Time above 250   % Time Below 70      PRE-MEAL Fasting Lunch Dinner Bedtime Overall  Glucose range:       Averages:  127 148  144   POST-MEAL PC Breakfast PC Lunch PC Dinner  Glucose range:     Averages: 163 172 154        Previous blood Glucose readings  PRE-MEAL Fasting Lunch Dinner Bedtime Overall  Glucose range: 113-154   133   Mean/median:     144   POST-MEAL PC Breakfast PC Lunch PC Dinner  Glucose range:   ?  Mean/median:          Dietician visit, most recent: 2017  Weight history:  Wt Readings from Last 3 Encounters:  03/14/21 142 lb 12.8 oz (64.8 kg)  01/10/21 144 lb (65.3 kg)  11/18/20 139 lb 6.4 oz (63.2 kg)    Glycemic control:   Lab Results  Component Value Date   HGBA1C 6.8 02/23/2021   HGBA1C 8.4 (H) 11/15/2020   HGBA1C 7.3 (H) 07/22/2020   Lab Results  Component Value Date   MICROALBUR 24 08/14/2019   LDLCALC 92 02/23/2021   CREATININE 0.9 02/23/2021   No results found for: [facility]  Lab Results  Component Value Date   FRUCTOSAMINE 332 (H) 01/06/2021   FRUCTOSAMINE 280 02/25/2020    Office Visit on 03/14/2021  Component Date Value Ref Range Status   Glucose 02/23/2021 128   Final   Creatinine  02/23/2021 0.9  0.6 - 1.3 Final   LDL Cholesterol 02/23/2021 92   Final   Hemoglobin A1C 02/23/2021 6.8   Final    Allergies as of 03/14/2021   No Known Allergies      Medication List        Accurate as of March 14, 2021  2:59 PM. If you have any questions, ask your nurse or doctor.          acetaminophen 500 MG tablet Commonly known as: TYLENOL Take 1,000 mg by mouth every 6 (six) hours as needed for moderate pain.   Aspirin Low Dose 81 MG chewable tablet Generic drug: aspirin CHEW 1 TABLET BY MOUTH ONCE A DAY   Carestart COVID-19 Home Test Kit Generic drug: COVID-19 At Home Antigen Test Use as directed   donepezil 10 MG tablet Commonly known as: ARICEPT Take 1 tablet every morning after breakfast   donepezil 23 MG Tabs tablet Commonly known as: ARICEPT Take 1 tablet by mouth daily.   Fluad Quadrivalent 0.5 ML injection Generic drug: influenza vaccine adjuvanted Inject into the muscle.   freestyle lancets USE TO CHECK BLOOD SUGAR ONCE DAILY   OneTouch Delica Lancets 03B Misc Use to check blood sugar daily.   FreeStyle Libre 2 Sensor Misc Use to check blood sugar daily.   FREESTYLE LITE test strip Generic drug: glucose blood 1 each by Other route as needed for other. Use as instructed to check blood sugar once a day dx code E11.65   glucose blood test strip Use to check blood sugar twice daily   Jardiance 10 MG Tabs tablet Generic drug: empagliflozin TAKE 1 TABLET BY MOUTH ONCE DAILY WITH BREAKFAST   metFORMIN 1000 MG tablet Commonly known as: GLUCOPHAGE Take 1 tablet (1,000 mg total) by mouth 2 (two) times daily with a meal.   OneTouch Verio Reflect w/Device Kit Use to check blood sugar daily.   pantoprazole 40 MG tablet Commonly known as: PROTONIX Take 40 mg by mouth daily as needed (for acid reflux).   Pfizer COVID-19 Vac Bivalent injection Generic drug: COVID-19 mRNA bivalent vaccine Therapist, music) Inject into the muscle.   pioglitazone  30 MG tablet Commonly known as: ACTOS TAKE 1 TABLET BY MOUTH ONCE A DAY   polyethylene glycol-electrolytes 420 g solution Commonly known as: NuLYTELY Use as directed   repaglinide 0.5 MG tablet Commonly known as: PRANDIN Take 2 tablets  by mouth before breakfast and take 2 tablets by mouth before lunch.   rosuvastatin 20 MG tablet Commonly known as: CRESTOR TAKE 1 TABLET BY MOUTH ONCE A DAY        Allergies: No Known Allergies  Past Medical History:  Diagnosis Date   Arthritis    Diabetes mellitus without complication (HCC)    GERD (gastroesophageal reflux disease)    High cholesterol     Past Surgical History:  Procedure Laterality Date   CLEFT LIP REPAIR     CLEFT PALATE REPAIR      Family History  Problem Relation Age of Onset   Diabetes Father    Heart disease Neg Hx     Social History:  reports that he has never smoked. He has never used smokeless tobacco. He reports current alcohol use. He reports that he does not use drugs.   Review of Systems   Lipid history: Lipids controlled with 20 mg Crestor, followed by PCP   Lab Results  Component Value Date   LDLCALC 92 02/23/2021   TRIG 62 08/07/2019           Blood pressure history:  Usually has low normal readings Serum cortisol checked for his low normal blood pressure reading was normal midmorning at 15  BP Readings from Last 3 Encounters:  03/14/21 114/64  01/10/21 112/70  11/18/20 100/64    Most recent eye exam was in 9/21  Most recent foot exam: 11/21  Currently known complications of diabetes: None  LABS:  Office Visit on 03/14/2021  Component Date Value Ref Range Status   Glucose 02/23/2021 128   Final   Creatinine 02/23/2021 0.9  0.6 - 1.3 Final   LDL Cholesterol 02/23/2021 92   Final   Hemoglobin A1C 02/23/2021 6.8   Final    Physical Examination:  BP 114/64 (BP Location: Left Arm, Patient Position: Sitting, Cuff Size: Normal)   Pulse 68   Ht '5\' 8"'  (1.727 m)   Wt 142 lb  12.8 oz (64.8 kg)   SpO2 95%   BMI 21.71 kg/m       ASSESSMENT:  Diabetes type 2 non-insulin-dependent  See history of present illness for detailed discussion of current diabetes management, blood sugar patterns and problems identified  A1c is much better at 6.8 compared to 8.4  He is on Metformin 1 g twice daily, Prandin 0.5 mg AC 3 times daily and Jardiance 10 mg as well as Actos 30 mg  His A1c has improved significantly mostly from adjusting his diet based on his blood sugar readings using the freestyle libre now  Is concerned about the cost of this however Recently only has occasional high readings after breakfast and lunch and rarely after dinner  Discussed that he tends to have higher readings after any high fat meals especially at lunch and dinner Also at times even with eating a low carbohydrate bread at lunchtime and sugars tend to go up most of the time Doing well with exercise Jardiance has not been increased because of potential for weight loss Reassured him that his control is excellent with fasting blood sugars mostly near target with some fluctuation; fasting readings do not show significant dawn phenomenon at this time and may somewhat depend on his diet the night before  PLAN:    He needs to take Prandin for any high fat meals, likely needs 1 tablet at dinnertime unless eating only a protein and salad or nonstarchy vegetables He probably needs to cover his  meals if he has about 5 g of fat per serving  Also will need likely 1-2 tablets at lunchtime with bread but also will depend on meal size Does not need 0.5 mg Prandin at breakfast unless eating more carbohydrates or high-fat meal He can take Prandin up to 30 minutes before eating or going out to eat As before he needs to target his blood sugars below at least 180 after meals Try taking metformin 1 tablet at dinner and 1 at bedtime if he can remember to help his fasting readings Given co-pay card for single care  to see if he can get freestyle libre at a better price for cash pay since he will start Medicare next month  Total visit time including review of continuous glucose monitoring data and counseling: 30 minutes  Patient Instructions  Take 1 Metformin at dinner and 1 at bedtime  Prandin at lunch and dinner for any hi carb meals or >5g fat intake     Elayne Snare 03/14/2021, 2:59 PM   Note: This office note was prepared with Dragon voice recognition system technology. Any transcriptional errors that result from this process are unintentional.

## 2021-03-14 NOTE — Patient Instructions (Addendum)
Take 1 Metformin at dinner and 1 at bedtime  Prandin at lunch and dinner for any hi carb meals or >5g fat intake

## 2021-03-17 ENCOUNTER — Ambulatory Visit: Payer: 59 | Admitting: Dietician

## 2021-03-24 ENCOUNTER — Other Ambulatory Visit (HOSPITAL_COMMUNITY): Payer: Self-pay

## 2021-03-24 ENCOUNTER — Other Ambulatory Visit: Payer: Self-pay | Admitting: Endocrinology

## 2021-03-24 MED ORDER — PIOGLITAZONE HCL 30 MG PO TABS
ORAL_TABLET | Freq: Every day | ORAL | 1 refills | Status: DC
  Filled 2021-03-24: qty 90, 90d supply, fill #0

## 2021-03-24 MED ORDER — REPAGLINIDE 0.5 MG PO TABS
ORAL_TABLET | ORAL | 1 refills | Status: DC
  Filled 2021-03-24: qty 90, fill #0
  Filled 2021-04-21: qty 90, 22d supply, fill #0
  Filled 2021-05-24: qty 90, 22d supply, fill #1
  Filled 2021-07-12: qty 90, 23d supply, fill #1

## 2021-03-24 MED FILL — Aspirin Chew Tab 81 MG: ORAL | 30 days supply | Qty: 30 | Fill #2 | Status: AC

## 2021-04-09 ENCOUNTER — Other Ambulatory Visit (HOSPITAL_COMMUNITY): Payer: Self-pay

## 2021-04-11 ENCOUNTER — Other Ambulatory Visit (HOSPITAL_COMMUNITY): Payer: Self-pay

## 2021-04-19 ENCOUNTER — Other Ambulatory Visit: Payer: Self-pay | Admitting: Endocrinology

## 2021-04-21 ENCOUNTER — Other Ambulatory Visit (HOSPITAL_COMMUNITY): Payer: Self-pay

## 2021-04-21 MED ORDER — METFORMIN HCL 1000 MG PO TABS
1000.0000 mg | ORAL_TABLET | Freq: Two times a day (BID) | ORAL | 1 refills | Status: DC
  Filled 2021-04-21: qty 180, 90d supply, fill #0
  Filled 2021-07-12: qty 180, 90d supply, fill #1

## 2021-04-21 MED FILL — Aspirin Chew Tab 81 MG: ORAL | 30 days supply | Qty: 30 | Fill #3 | Status: CN

## 2021-04-26 ENCOUNTER — Other Ambulatory Visit (HOSPITAL_COMMUNITY): Payer: Self-pay

## 2021-05-06 ENCOUNTER — Other Ambulatory Visit (HOSPITAL_COMMUNITY): Payer: Self-pay

## 2021-05-11 ENCOUNTER — Other Ambulatory Visit (HOSPITAL_COMMUNITY): Payer: Self-pay

## 2021-05-11 MED ORDER — DONEPEZIL HCL 23 MG PO TABS
23.0000 mg | ORAL_TABLET | Freq: Every day | ORAL | 3 refills | Status: AC
  Filled 2021-05-11: qty 90, 90d supply, fill #0
  Filled 2021-09-09: qty 90, 90d supply, fill #1

## 2021-05-12 ENCOUNTER — Other Ambulatory Visit (HOSPITAL_COMMUNITY): Payer: Self-pay

## 2021-05-24 ENCOUNTER — Other Ambulatory Visit: Payer: Self-pay | Admitting: Endocrinology

## 2021-05-24 ENCOUNTER — Other Ambulatory Visit (HOSPITAL_COMMUNITY): Payer: Self-pay

## 2021-05-25 ENCOUNTER — Other Ambulatory Visit (HOSPITAL_COMMUNITY): Payer: Self-pay

## 2021-05-26 ENCOUNTER — Other Ambulatory Visit (HOSPITAL_COMMUNITY): Payer: Self-pay

## 2021-05-27 ENCOUNTER — Other Ambulatory Visit: Payer: Self-pay

## 2021-05-27 ENCOUNTER — Other Ambulatory Visit (HOSPITAL_COMMUNITY): Payer: Self-pay

## 2021-05-27 DIAGNOSIS — E1165 Type 2 diabetes mellitus with hyperglycemia: Secondary | ICD-10-CM

## 2021-05-27 MED ORDER — REPAGLINIDE 1 MG PO TABS
ORAL_TABLET | ORAL | 2 refills | Status: DC
  Filled 2021-05-27: qty 60, 30d supply, fill #0

## 2021-05-27 MED ORDER — EMPAGLIFLOZIN 10 MG PO TABS
ORAL_TABLET | Freq: Every day | ORAL | 1 refills | Status: DC
  Filled 2021-05-27: qty 90, 90d supply, fill #0
  Filled 2021-08-22: qty 90, 90d supply, fill #1

## 2021-05-30 ENCOUNTER — Other Ambulatory Visit (HOSPITAL_COMMUNITY): Payer: Self-pay

## 2021-06-06 ENCOUNTER — Encounter: Payer: Self-pay | Admitting: Endocrinology

## 2021-06-06 ENCOUNTER — Other Ambulatory Visit (HOSPITAL_COMMUNITY): Payer: Self-pay

## 2021-06-07 ENCOUNTER — Other Ambulatory Visit: Payer: Self-pay

## 2021-06-07 ENCOUNTER — Other Ambulatory Visit: Payer: Self-pay | Admitting: Endocrinology

## 2021-06-07 DIAGNOSIS — E1165 Type 2 diabetes mellitus with hyperglycemia: Secondary | ICD-10-CM

## 2021-06-09 ENCOUNTER — Other Ambulatory Visit (HOSPITAL_COMMUNITY): Payer: Self-pay

## 2021-06-09 ENCOUNTER — Ambulatory Visit: Payer: 59 | Admitting: Endocrinology

## 2021-06-09 ENCOUNTER — Other Ambulatory Visit: Payer: 59

## 2021-06-10 ENCOUNTER — Other Ambulatory Visit (HOSPITAL_COMMUNITY): Payer: Self-pay

## 2021-06-10 LAB — HEMOGLOBIN A1C
Est. average glucose Bld gHb Est-mCnc: 157 mg/dL
Hgb A1c MFr Bld: 7.1 % — ABNORMAL HIGH (ref 4.8–5.6)

## 2021-06-10 LAB — COMPREHENSIVE METABOLIC PANEL
ALT: 17 IU/L (ref 0–44)
AST: 17 IU/L (ref 0–40)
Albumin/Globulin Ratio: 2 (ref 1.2–2.2)
Albumin: 5.1 g/dL — ABNORMAL HIGH (ref 3.8–4.8)
Alkaline Phosphatase: 55 IU/L (ref 44–121)
BUN/Creatinine Ratio: 24 (ref 10–24)
BUN: 22 mg/dL (ref 8–27)
Bilirubin Total: 0.5 mg/dL (ref 0.0–1.2)
CO2: 24 mmol/L (ref 20–29)
Calcium: 9.7 mg/dL (ref 8.6–10.2)
Chloride: 101 mmol/L (ref 96–106)
Creatinine, Ser: 0.9 mg/dL (ref 0.76–1.27)
Globulin, Total: 2.5 g/dL (ref 1.5–4.5)
Glucose: 103 mg/dL — ABNORMAL HIGH (ref 70–99)
Potassium: 4.6 mmol/L (ref 3.5–5.2)
Sodium: 141 mmol/L (ref 134–144)
Total Protein: 7.6 g/dL (ref 6.0–8.5)
eGFR: 94 mL/min/{1.73_m2} (ref >59)

## 2021-06-13 ENCOUNTER — Other Ambulatory Visit (HOSPITAL_COMMUNITY): Payer: Self-pay

## 2021-06-13 ENCOUNTER — Encounter: Payer: Self-pay | Admitting: Endocrinology

## 2021-06-13 ENCOUNTER — Other Ambulatory Visit: Payer: Self-pay

## 2021-06-13 ENCOUNTER — Ambulatory Visit (INDEPENDENT_AMBULATORY_CARE_PROVIDER_SITE_OTHER): Payer: Medicare Other | Admitting: Endocrinology

## 2021-06-13 VITALS — BP 120/70 | HR 69 | Ht 68.0 in | Wt 139.6 lb

## 2021-06-13 DIAGNOSIS — E7800 Pure hypercholesterolemia, unspecified: Secondary | ICD-10-CM | POA: Diagnosis not present

## 2021-06-13 DIAGNOSIS — E1165 Type 2 diabetes mellitus with hyperglycemia: Secondary | ICD-10-CM | POA: Diagnosis not present

## 2021-06-13 MED ORDER — ROSUVASTATIN CALCIUM 20 MG PO TABS
ORAL_TABLET | ORAL | 3 refills | Status: AC
  Filled 2021-06-13: qty 90, 90d supply, fill #0
  Filled 2021-09-07: qty 90, 90d supply, fill #1

## 2021-06-13 MED ORDER — PIOGLITAZONE HCL 45 MG PO TABS
45.0000 mg | ORAL_TABLET | Freq: Every day | ORAL | 1 refills | Status: DC
  Filled 2021-06-13: qty 90, 90d supply, fill #0
  Filled 2021-09-01: qty 90, 90d supply, fill #1

## 2021-06-13 NOTE — Progress Notes (Signed)
Patient ID: Joshua Lamb, male   DOB: 1955-04-27, 66 y.o.   MRN: [id number]           Reason for Appointment: Follow-up for Type 2 Diabetes  Referring PCP: Marton Redwood   History of Present Illness:          Date of diagnosis of type 2 diabetes mellitus:   2017    Background history:  He was diagnosed when he had blood sugars above normal without any symptoms He believes his highest A1c was 7.8 Initially was treated with Metformin and apparently because of inadequate level of control he was given 25 mg Jardiance also probably in 2017 Apparently his weight prior to starting Jardiance was 190 pounds He progressively lost weight with this and his A1c has been between 6.3 and 7.3 since 2020  Recent history:   His A1c is slightly higher at 7.1 compared to 6.8 previously  Non-insulin hypoglycemic drugs: Metformin 1 g twice daily, Jardiance 10 mg daily, Actos 30 mg daily, Prandin 0.5 mg 3 times daily  Current management, blood sugar patterns and problems identified: He has been continuing the freestyle libre to monitor his blood sugars but may not use it all the time because of cost He has had variable control and he thinks that his blood sugars have been erratic because of moving to a different home and location and frequently eating out  He has been advised to try and take his Prandin consistently before meals especially if eating more high carbohydrate or high fat meals  He sometimes forgets to do this before eating and blood sugars may go up Usually not taking it at breakfast since he has less carbohydrate in the morning  He takes as much as 1.5 mg Prandin for meals are directly high in carbohydrate or large portions when eating out Also will try to take it ahead of time when he is at the restaurant but rarely will forget highest average blood sugar 175 after dinner Weight is down 2 pounds No edema with Actos His lab glucose was 103 this morning fasting but his fasting readings  on the sensor are mostly in the 130-140 range        Side effects from medications have been: None     Meal times are:  Breakfast is at 7 AM, dinner 6:30 PM  Typical meal intake: Breakfast is   toast with eggs and sausage with 4 ounces of juice.  Dinner usually salad, vegetables and protein.  Snacks apple or peanuts             Glucose monitoring:           Glucometer:  Freestyle libre   CGM use % of time 60  2-week average/GV 146  Time in range       82%  % Time Above 180 14+4     % Time Below 70 0     PRE-MEAL 4-6 AM Lunch Dinner Bedtime Overall  Glucose range:       Averages: 125   146 148   POST-MEAL PC Breakfast PC Lunch PC Dinner  Glucose range:     Averages: 159 166 175   Previously:  CGM use % of time 80  2-week average/GV 144  Time in range 87       %  % Time Above 180 11+2  % Time above 250   % Time Below 70      PRE-MEAL Fasting Lunch Dinner Bedtime Overall  Glucose  range:       Averages:  127 148  144   POST-MEAL PC Breakfast PC Lunch PC Dinner  Glucose range:     Averages: 163 172 154     Dietician visit, most recent: 2017  Weight history:  Wt Readings from Last 3 Encounters:  06/13/21 139 lb 9.6 oz (63.3 kg)  03/14/21 142 lb 12.8 oz (64.8 kg)  01/10/21 144 lb (65.3 kg)    Glycemic control:   Lab Results  Component Value Date   HGBA1C 7.1 (H) 06/09/2021   HGBA1C 6.8 02/23/2021   HGBA1C 8.4 (H) 11/15/2020   Lab Results  Component Value Date   MICROALBUR 24 08/14/2019   LDLCALC 92 02/23/2021   CREATININE 0.90 06/09/2021   No results found for: [facility]  Lab Results  Component Value Date   FRUCTOSAMINE 332 (H) 01/06/2021   FRUCTOSAMINE 280 02/25/2020    Orders Only on 06/07/2021  Component Date Value Ref Range Status   Glucose 06/09/2021 103 (H)  70 - 99 mg/dL Final   BUN 06/09/2021 22  8 - 27 mg/dL Final   Creatinine, Ser 06/09/2021 0.90  0.76 - 1.27 mg/dL Final   eGFR 06/09/2021 94  >59 mL/min/1.73 Final    BUN/Creatinine Ratio 06/09/2021 24  10 - 24 Final   Sodium 06/09/2021 141  134 - 144 mmol/L Final   Potassium 06/09/2021 4.6  3.5 - 5.2 mmol/L Final   Chloride 06/09/2021 101  96 - 106 mmol/L Final   CO2 06/09/2021 24  20 - 29 mmol/L Final   Calcium 06/09/2021 9.7  8.6 - 10.2 mg/dL Final   Total Protein 06/09/2021 7.6  6.0 - 8.5 g/dL Final   Albumin 06/09/2021 5.1 (H)  3.8 - 4.8 g/dL Final   Globulin, Total 06/09/2021 2.5  1.5 - 4.5 g/dL Final   Albumin/Globulin Ratio 06/09/2021 2.0  1.2 - 2.2 Final   Bilirubin Total 06/09/2021 0.5  0.0 - 1.2 mg/dL Final   Alkaline Phosphatase 06/09/2021 55  44 - 121 IU/L Final   AST 06/09/2021 17  0 - 40 IU/L Final   ALT 06/09/2021 17  0 - 44 IU/L Final   Hgb A1c MFr Bld 06/09/2021 7.1 (H)  4.8 - 5.6 % Final   Comment:          Prediabetes: 5.7 - 6.4          Diabetes: >6.4          Glycemic control for adults with diabetes: <7.0    Est. average glucose Bld gHb Est-m* 06/09/2021 157  mg/dL Final    Allergies as of 06/13/2021   No Known Allergies      Medication List        Accurate as of June 13, 2021  1:32 PM. If you have any questions, ask your nurse or doctor.          acetaminophen 500 MG tablet Commonly known as: TYLENOL Take 1,000 mg by mouth every 6 (six) hours as needed for moderate pain.   Carestart COVID-19 Home Test Kit Generic drug: COVID-19 At Home Antigen Test Use as directed   donepezil 10 MG tablet Commonly known as: ARICEPT Take 1 tablet every morning after breakfast   donepezil 23 MG Tabs tablet Commonly known as: ARICEPT Take 1 tablet by mouth daily.   donepezil 23 MG Tabs tablet Commonly known as: ARICEPT Take 1 tablet by mouth daily.   Fluad Quadrivalent 0.5 ML injection Generic drug: influenza vaccine adjuvanted Inject into  the muscle.   FreeStyle Libre 2 Sensor Misc Use to check blood sugar daily.   FREESTYLE LITE test strip Generic drug: glucose blood 1 each by Other route as needed for other.  Use as instructed to check blood sugar once a day dx code E11.65   glucose blood test strip Use to check blood sugar twice daily   Jardiance 10 MG Tabs tablet Generic drug: empagliflozin TAKE 1 TABLET BY MOUTH ONCE DAILY WITH BREAKFAST   metFORMIN 1000 MG tablet Commonly known as: GLUCOPHAGE Take 1 tablet by mouth 2 times daily with a meal.   OneTouch Delica Lancets 09B Misc Use to check blood sugar daily.   OneTouch Verio Reflect w/Device Kit Use to check blood sugar daily.   pantoprazole 40 MG tablet Commonly known as: PROTONIX Take 40 mg by mouth daily as needed (for acid reflux).   Pfizer COVID-19 Vac Bivalent injection Generic drug: COVID-19 mRNA bivalent vaccine Therapist, music) Inject into the muscle.   pioglitazone 30 MG tablet Commonly known as: ACTOS TAKE 1 TABLET BY MOUTH ONCE A DAY   polyethylene glycol-electrolytes 420 g solution Commonly known as: NuLYTELY Use as directed   repaglinide 0.5 MG tablet Commonly known as: PRANDIN Take 2 tablets by mouth before breakfast and take 2 tablets by mouth before lunch.   repaglinide 1 MG tablet Commonly known as: PRANDIN Take 1 tablet before breakfast and 1 tablet before lunch   rosuvastatin 20 MG tablet Commonly known as: CRESTOR TAKE 1 TABLET BY MOUTH ONCE A DAY What changed: Another medication with the same name was added. Make sure you understand how and when to take each.   rosuvastatin 20 MG tablet Commonly known as: CRESTOR TAKE 1 TABLET BY MOUTH ONCE A DAY What changed: You were already taking a medication with the same name, and this prescription was added. Make sure you understand how and when to take each.        Allergies: No Known Allergies  Past Medical History:  Diagnosis Date   Arthritis    Diabetes mellitus without complication (HCC)    GERD (gastroesophageal reflux disease)    High cholesterol     Past Surgical History:  Procedure Laterality Date   CLEFT LIP REPAIR     CLEFT PALATE  REPAIR      Family History  Problem Relation Age of Onset   Diabetes Father    Heart disease Neg Hx     Social History:  reports that he has never smoked. He has never used smokeless tobacco. He reports current alcohol use. He reports that he does not use drugs.   Review of Systems   Lipid history: Lipids controlled with 20 mg Crestor, followed by PCP   Lab Results  Component Value Date   LDLCALC 92 02/23/2021   TRIG 62 08/07/2019           Blood pressure history:  Periodically has low normal readings Serum cortisol checked for his low normal blood pressure reading was normal midmorning at 15  BP Readings from Last 3 Encounters:  06/13/21 120/70  03/14/21 114/64  01/10/21 112/70    Most recent eye exam was in 9/21  Most recent foot exam: 11/21  Currently known complications of diabetes: None  LABS:  Orders Only on 06/07/2021  Component Date Value Ref Range Status   Glucose 06/09/2021 103 (H)  70 - 99 mg/dL Final   BUN 06/09/2021 22  8 - 27 mg/dL Final   Creatinine, Ser 06/09/2021 0.90  0.76 - 1.27 mg/dL Final   eGFR 06/09/2021 94  >59 mL/min/1.73 Final   BUN/Creatinine Ratio 06/09/2021 24  10 - 24 Final   Sodium 06/09/2021 141  134 - 144 mmol/L Final   Potassium 06/09/2021 4.6  3.5 - 5.2 mmol/L Final   Chloride 06/09/2021 101  96 - 106 mmol/L Final   CO2 06/09/2021 24  20 - 29 mmol/L Final   Calcium 06/09/2021 9.7  8.6 - 10.2 mg/dL Final   Total Protein 06/09/2021 7.6  6.0 - 8.5 g/dL Final   Albumin 06/09/2021 5.1 (H)  3.8 - 4.8 g/dL Final   Globulin, Total 06/09/2021 2.5  1.5 - 4.5 g/dL Final   Albumin/Globulin Ratio 06/09/2021 2.0  1.2 - 2.2 Final   Bilirubin Total 06/09/2021 0.5  0.0 - 1.2 mg/dL Final   Alkaline Phosphatase 06/09/2021 55  44 - 121 IU/L Final   AST 06/09/2021 17  0 - 40 IU/L Final   ALT 06/09/2021 17  0 - 44 IU/L Final   Hgb A1c MFr Bld 06/09/2021 7.1 (H)  4.8 - 5.6 % Final   Comment:          Prediabetes: 5.7 - 6.4          Diabetes:  >6.4          Glycemic control for adults with diabetes: <7.0    Est. average glucose Bld gHb Est-m* 06/09/2021 157  mg/dL Final    Physical Examination:  BP 120/70    Pulse 69    Ht '5\' 8"'  (1.727 m)    Wt 139 lb 9.6 oz (63.3 kg)    SpO2 96%    BMI 21.23 kg/m       ASSESSMENT:  Diabetes type 2 non-insulin-dependent  See history of present illness for detailed discussion of current diabetes management, blood sugar patterns and problems identified  A1c is slightly higher than before at 7.1  He is on Metformin 1 g twice daily, Prandin 0.5 mg AC 3 times daily and Jardiance 10 mg as well as Actos 30 mg  His A1c has gone up probably from enlarged being able to be consistent with his diet with his moving to a new home and frequently eating out until recently  As before he has some difficulty with getting dependent on time when he is eating and sometimes underestimating how much he needs  He has lost a little weight this time Fasting readings are usually is slightly high when he checks them before breakfast  Jardiance has not been increased because of potential for weight loss He continues to prefer using the freestyle libre sensor to monitor his blood sugar  Currently no side effects from Actos and metformin  PLAN:    He will need to try and take Prandin 30 minutes before eating to help with better compliance with this Continue to adjust the dose based on amount of carbohydrate intake extra 1 to 2 tablets for eating out or relatively high fat meals  If he continues to have high readings after dinner we will need to consider 2 mg dose Trial of 45 mg Actos instead of 30 mg for better efficacy and may neutralize any tendency to lose weight Regular exercise Discussed blood sugar targets fasting and after meals  He will establish with a new PCP locally and have his lipids monitored with them  Total visit time including review of continuous glucose monitoring data and counseling: 30  minutes  There are no Patient Instructions on file  for this visit.     Elayne Snare 06/13/2021, 1:32 PM   Note: This office note was prepared with Dragon voice recognition system technology. Any transcriptional errors that result from this process are unintentional.

## 2021-06-13 NOTE — Patient Instructions (Signed)
Check blood sugars on waking up   Also check blood sugars about 2 hours after meals and do this after different meals by rotation  Recommended blood sugar levels on waking up are 90-130 and about 2 hours after meal is 130-160  Please bring your blood sugar monitor to each visit, thank you  

## 2021-06-24 ENCOUNTER — Other Ambulatory Visit (HOSPITAL_COMMUNITY): Payer: Self-pay

## 2021-06-29 ENCOUNTER — Encounter: Payer: Self-pay | Admitting: Endocrinology

## 2021-07-06 ENCOUNTER — Other Ambulatory Visit (HOSPITAL_COMMUNITY): Payer: Self-pay

## 2021-07-13 ENCOUNTER — Other Ambulatory Visit (HOSPITAL_COMMUNITY): Payer: Self-pay

## 2021-07-14 ENCOUNTER — Other Ambulatory Visit (HOSPITAL_COMMUNITY): Payer: Self-pay

## 2021-07-29 ENCOUNTER — Other Ambulatory Visit (HOSPITAL_COMMUNITY): Payer: Self-pay

## 2021-08-04 ENCOUNTER — Other Ambulatory Visit (HOSPITAL_COMMUNITY): Payer: Self-pay

## 2021-08-04 MED ORDER — ROSUVASTATIN CALCIUM 20 MG PO TABS
20.0000 mg | ORAL_TABLET | Freq: Every day | ORAL | 3 refills | Status: DC
  Filled 2021-08-04: qty 90, 90d supply, fill #0

## 2021-08-05 ENCOUNTER — Other Ambulatory Visit: Payer: Self-pay | Admitting: Endocrinology

## 2021-08-05 ENCOUNTER — Other Ambulatory Visit (HOSPITAL_COMMUNITY): Payer: Self-pay

## 2021-08-05 DIAGNOSIS — E1165 Type 2 diabetes mellitus with hyperglycemia: Secondary | ICD-10-CM

## 2021-08-05 MED ORDER — FREESTYLE LIBRE 2 SENSOR MISC
3 refills | Status: AC
  Filled 2021-08-05: qty 2, 28d supply, fill #0
  Filled 2021-09-09: qty 2, 28d supply, fill #1
  Filled 2021-09-29: qty 2, 28d supply, fill #2

## 2021-08-10 ENCOUNTER — Other Ambulatory Visit (HOSPITAL_COMMUNITY): Payer: Self-pay

## 2021-08-10 ENCOUNTER — Other Ambulatory Visit: Payer: Self-pay | Admitting: Endocrinology

## 2021-08-11 ENCOUNTER — Other Ambulatory Visit (HOSPITAL_COMMUNITY): Payer: Self-pay

## 2021-08-11 MED ORDER — REPAGLINIDE 0.5 MG PO TABS
ORAL_TABLET | ORAL | 1 refills | Status: DC
  Filled 2021-08-11: qty 90, 23d supply, fill #0
  Filled 2021-09-09: qty 90, 23d supply, fill #1

## 2021-08-12 ENCOUNTER — Other Ambulatory Visit (HOSPITAL_COMMUNITY): Payer: Self-pay

## 2021-08-22 ENCOUNTER — Other Ambulatory Visit (HOSPITAL_COMMUNITY): Payer: Self-pay

## 2021-09-01 ENCOUNTER — Other Ambulatory Visit (HOSPITAL_COMMUNITY): Payer: Self-pay

## 2021-09-02 ENCOUNTER — Other Ambulatory Visit (HOSPITAL_COMMUNITY): Payer: Self-pay

## 2021-09-08 ENCOUNTER — Other Ambulatory Visit (HOSPITAL_COMMUNITY): Payer: Self-pay

## 2021-09-09 ENCOUNTER — Other Ambulatory Visit: Payer: Self-pay | Admitting: Endocrinology

## 2021-09-09 ENCOUNTER — Encounter: Payer: Self-pay | Admitting: Endocrinology

## 2021-09-09 ENCOUNTER — Other Ambulatory Visit (HOSPITAL_COMMUNITY): Payer: Self-pay

## 2021-09-09 ENCOUNTER — Other Ambulatory Visit: Payer: Self-pay

## 2021-09-09 DIAGNOSIS — E1165 Type 2 diabetes mellitus with hyperglycemia: Secondary | ICD-10-CM

## 2021-09-09 NOTE — Addendum Note (Signed)
Addended by: Elayne Snare on: 09/09/2021 05:16 PM   Modules accepted: Orders

## 2021-09-12 ENCOUNTER — Other Ambulatory Visit: Payer: Self-pay

## 2021-09-12 DIAGNOSIS — E1165 Type 2 diabetes mellitus with hyperglycemia: Secondary | ICD-10-CM

## 2021-09-12 NOTE — Addendum Note (Signed)
Addended by: Kaylyn Lim I on: 09/12/2021 08:40 AM   Modules accepted: Orders

## 2021-09-13 ENCOUNTER — Other Ambulatory Visit (HOSPITAL_COMMUNITY): Payer: Self-pay

## 2021-09-15 ENCOUNTER — Ambulatory Visit (INDEPENDENT_AMBULATORY_CARE_PROVIDER_SITE_OTHER): Payer: Medicare Other | Admitting: Endocrinology

## 2021-09-15 ENCOUNTER — Encounter: Payer: Self-pay | Admitting: Endocrinology

## 2021-09-15 VITALS — BP 118/60 | HR 61 | Ht 68.0 in | Wt 138.0 lb

## 2021-09-15 DIAGNOSIS — E1165 Type 2 diabetes mellitus with hyperglycemia: Secondary | ICD-10-CM

## 2021-09-15 DIAGNOSIS — R634 Abnormal weight loss: Secondary | ICD-10-CM | POA: Diagnosis not present

## 2021-09-15 LAB — BASIC METABOLIC PANEL
BUN/Creatinine Ratio: 24 (ref 10–24)
BUN: 20 mg/dL (ref 8–27)
CO2: 23 mmol/L (ref 20–29)
Calcium: 10 mg/dL (ref 8.6–10.2)
Chloride: 99 mmol/L (ref 96–106)
Creatinine, Ser: 0.83 mg/dL (ref 0.76–1.27)
Glucose: 129 mg/dL — ABNORMAL HIGH (ref 70–99)
Potassium: 4.5 mmol/L (ref 3.5–5.2)
Sodium: 140 mmol/L (ref 134–144)
eGFR: 96 mL/min/{1.73_m2} (ref >59)

## 2021-09-15 LAB — HEMOGLOBIN A1C
Est. average glucose Bld gHb Est-mCnc: 171 mg/dL
Hgb A1c MFr Bld: 7.6 % — ABNORMAL HIGH (ref 4.8–5.6)

## 2021-09-15 LAB — MICROALBUMIN / CREATININE URINE RATIO
Creatinine, Urine: 94.7 mg/dL
Microalb/Creat Ratio: 20 mg/g creat (ref 0–29)
Microalbumin, Urine: 19.3 ug/mL

## 2021-09-15 MED ORDER — REPAGLINIDE 2 MG PO TABS
2.0000 mg | ORAL_TABLET | Freq: Two times a day (BID) | ORAL | 2 refills | Status: DC
Start: 2021-09-15 — End: 2022-04-10

## 2021-09-15 NOTE — Patient Instructions (Addendum)
Take Repeglanide 0.'5mg'$  before bfst and '2mg'$  before large carb meals  Walk daily  Check sugar 4x daily

## 2021-09-15 NOTE — Progress Notes (Signed)
Patient ID: Joshua Lamb, male   DOB: [birth year], 66 y.o.   MRN: [id number]           Reason for Appointment: Follow-up for Type 2 Diabetes  Referring PCP: Marton Redwood   History of Present Illness:          Date of diagnosis of type 2 diabetes mellitus:   2017    Background history:  He was diagnosed when he had blood sugars above normal without any symptoms He believes his highest A1c was 7.8 Initially was treated with Metformin and apparently because of inadequate level of control he was given 25 mg Jardiance also probably in 2017 Apparently his weight prior to starting Jardiance was 190 pounds He progressively lost weight with this and his A1c has been between 6.3 and 7.3 since 2020  Recent history:   His A1c is progressively higher at 7.6 compared to 6.8 previously  Non-insulin hypoglycemic drugs: Metformin 1 g twice daily, Jardiance 10 mg daily, Actos 30 mg daily, Prandin 0.5 mg 3 times daily  Current management, blood sugar patterns and problems identified: He has been continuing the freestyle libre preoperatively and currently not getting insurance coverage for this Again he is having excessive postprandial hyperglycemia Likely more often having high sugars either based on the types of foods he is having with more carbohydrate or eating out or forgetting his PRANDIN  Also glucose monitoring may not be complete after dinner and a couple of days are incomplete PIOGLITAZONE was increased to 45 mg but does not appear to have benefit from this, no edema He has been advised to try and take his Prandin up to 30 minutes before meals especially if eating more high carbohydrate or high fat meals He will sometimes eat fast food hamburgers causing his blood sugars to be high He may not have his medication with him when he is going out Usually not taking it at breakfast since he has less carbohydrate in the morning but his blood sugars still go up after eating along with a dawn  phenomenon Highest blood sugars are generally after lunch highest blood sugar can be 350 Weight is down about 1        Side effects from medications have been: None     Meal times are:  Breakfast is at 7 AM, dinner 6:30 PM  Typical meal intake: Breakfast is   toast with eggs and sausage with 4 ounces of juice.  Dinner usually salad, vegetables and protein.  Snacks apple or peanuts             Glucose monitoring:           Glucometer:  Freestyle libre  Interpretation of his download as follows  Blood sugars are very consistently near target in the 130 range early part of the night and then showing dawn phenomenon followed by variable postprandially high readings especially after lunch and dinner with average nearly 190 after both meals Lowest blood sugars are right after midnight Premeal blood sugars are mildly increased at all meals and highest 158 before supper However postprandial excursions are quite variable, possibly more consistently higher after dinner with diet and complete  No hypoglycemia  CGM use % of time 69  2-week average/GV 158, GMI 7.1  Time in range       80 %  % Time Above 180 15+5     % Time Below 70 0     PRE-MEAL 4-6 AM Lunch Dinner Bedtime Overall  Glucose  range:       Averages: 143   156 158   POST-MEAL PC Breakfast PC Lunch PC Dinner  Glucose range:     Averages: 171 189 189     Dietician visit, most recent: 2017  Weight history:  Wt Readings from Last 3 Encounters:  09/15/21 138 lb (62.6 kg)  06/13/21 139 lb 9.6 oz (63.3 kg)  03/14/21 142 lb 12.8 oz (64.8 kg)    Glycemic control:   Lab Results  Component Value Date   HGBA1C 7.6 (H) 09/13/2021   HGBA1C 7.1 (H) 06/09/2021   HGBA1C 6.8 02/23/2021   Lab Results  Component Value Date   MICROALBUR 24 08/14/2019   LDLCALC 92 02/23/2021   CREATININE 0.83 09/13/2021   Lab Results  Component Value Date   MICRALBCREAT 20 09/13/2021    Lab Results  Component Value Date    FRUCTOSAMINE 332 (H) 01/06/2021   FRUCTOSAMINE 280 02/25/2020    Orders Only on 09/09/2021  Component Date Value Ref Range Status   Glucose 09/13/2021 129 (H)  70 - 99 mg/dL Final   BUN 09/13/2021 20  8 - 27 mg/dL Final   Creatinine, Ser 09/13/2021 0.83  0.76 - 1.27 mg/dL Final   eGFR 09/13/2021 96  >59 mL/min/1.73 Final   BUN/Creatinine Ratio 09/13/2021 24  10 - 24 Final   Sodium 09/13/2021 140  134 - 144 mmol/L Final   Potassium 09/13/2021 4.5  3.5 - 5.2 mmol/L Final   Chloride 09/13/2021 99  96 - 106 mmol/L Final   CO2 09/13/2021 23  20 - 29 mmol/L Final   Calcium 09/13/2021 10.0  8.6 - 10.2 mg/dL Final   Hgb A1c MFr Bld 09/13/2021 7.6 (H)  4.8 - 5.6 % Final   Comment:          Prediabetes: 5.7 - 6.4          Diabetes: >6.4          Glycemic control for adults with diabetes: <7.0    Est. average glucose Bld gHb Est-m* 09/13/2021 171  mg/dL Final   Creatinine, Urine 09/13/2021 94.7  Not Estab. mg/dL Final   Microalbumin, Urine 09/13/2021 19.3  Not Estab. ug/mL Final   Microalb/Creat Ratio 09/13/2021 20  0 - 29 mg/g creat Final   Comment:                        Normal:                0 -  29                        Moderately increased: 30 - 300                        Severely increased:       >300     Allergies as of 09/15/2021   No Known Allergies      Medication List        Accurate as of September 15, 2021  1:38 PM. If you have any questions, ask your nurse or doctor.          acetaminophen 500 MG tablet Commonly known as: TYLENOL Take 1,000 mg by mouth every 6 (six) hours as needed for moderate pain.   Carestart COVID-19 Home Test Kit Generic drug: COVID-19 At Home Antigen Test Use as directed   donepezil 10 MG tablet Commonly known  as: ARICEPT Take 1 tablet every morning after breakfast   donepezil 23 MG Tabs tablet Commonly known as: ARICEPT Take 1 tablet by mouth daily.   donepezil 23 MG Tabs tablet Commonly known as: ARICEPT Take 1 tablet by mouth  daily.   Fluad Quadrivalent 0.5 ML injection Generic drug: influenza vaccine adjuvanted Inject into the muscle.   FreeStyle Libre 2 Sensor Misc Use to check blood sugar daily. (change every 14 days)   FREESTYLE LITE test strip Generic drug: glucose blood 1 each by Other route as needed for other. Use as instructed to check blood sugar once a day dx code E11.65   glucose blood test strip Use to check blood sugar twice daily   Jardiance 10 MG Tabs tablet Generic drug: empagliflozin TAKE 1 TABLET BY MOUTH ONCE DAILY WITH BREAKFAST   metFORMIN 1000 MG tablet Commonly known as: GLUCOPHAGE Take 1 tablet by mouth 2 times daily with a meal.   OneTouch Delica Lancets 27N Misc Use to check blood sugar daily.   OneTouch Verio Reflect w/Device Kit Use to check blood sugar daily.   pantoprazole 40 MG tablet Commonly known as: PROTONIX Take 40 mg by mouth daily as needed (for acid reflux).   Pfizer COVID-19 Vac Bivalent injection Generic drug: COVID-19 mRNA bivalent vaccine Therapist, music) Inject into the muscle.   pioglitazone 45 MG tablet Commonly known as: Actos Take 1 tablet (45 mg total) by mouth daily.   polyethylene glycol-electrolytes 420 g solution Commonly known as: NuLYTELY Use as directed   repaglinide 1 MG tablet Commonly known as: PRANDIN Take 1 tablet before breakfast and 1 tablet before lunch   repaglinide 0.5 MG tablet Commonly known as: PRANDIN Take 2 tablets by mouth before breakfast and take 2 tablets by mouth before lunch.   rosuvastatin 20 MG tablet Commonly known as: CRESTOR TAKE 1 TABLET BY MOUTH ONCE A DAY   rosuvastatin 20 MG tablet Commonly known as: CRESTOR TAKE 1 TABLET BY MOUTH ONCE A DAY   rosuvastatin 20 MG tablet Commonly known as: CRESTOR TAKE 1 TABLET BY MOUTH ONCE A DAY        Allergies: No Known Allergies  Past Medical History:  Diagnosis Date   Arthritis    Diabetes mellitus without complication (HCC)    GERD  (gastroesophageal reflux disease)    High cholesterol     Past Surgical History:  Procedure Laterality Date   CLEFT LIP REPAIR     CLEFT PALATE REPAIR      Family History  Problem Relation Age of Onset   Diabetes Father    Heart disease Neg Hx     Social History:  reports that he has never smoked. He has never used smokeless tobacco. He reports current alcohol use. He reports that he does not use drugs.   Review of Systems   Lipid history: Lipids controlled with 20 mg Crestor, followed by PCP   Lab Results  Component Value Date   LDLCALC 92 02/23/2021   TRIG 62 08/07/2019           Blood pressure history:  Blood pressure may be low normal at times  BP Readings from Last 3 Encounters:  09/15/21 118/60  06/13/21 120/70  03/14/21 114/64    Most recent eye exam was in 9/21  Most recent foot exam: 11/21  Currently known complications of diabetes: None  LABS:  Orders Only on 09/09/2021  Component Date Value Ref Range Status   Glucose 09/13/2021 129 (H)  70 -  99 mg/dL Final   BUN 09/13/2021 20  8 - 27 mg/dL Final   Creatinine, Ser 09/13/2021 0.83  0.76 - 1.27 mg/dL Final   eGFR 09/13/2021 96  >59 mL/min/1.73 Final   BUN/Creatinine Ratio 09/13/2021 24  10 - 24 Final   Sodium 09/13/2021 140  134 - 144 mmol/L Final   Potassium 09/13/2021 4.5  3.5 - 5.2 mmol/L Final   Chloride 09/13/2021 99  96 - 106 mmol/L Final   CO2 09/13/2021 23  20 - 29 mmol/L Final   Calcium 09/13/2021 10.0  8.6 - 10.2 mg/dL Final   Hgb A1c MFr Bld 09/13/2021 7.6 (H)  4.8 - 5.6 % Final   Comment:          Prediabetes: 5.7 - 6.4          Diabetes: >6.4          Glycemic control for adults with diabetes: <7.0    Est. average glucose Bld gHb Est-m* 09/13/2021 171  mg/dL Final   Creatinine, Urine 09/13/2021 94.7  Not Estab. mg/dL Final   Microalbumin, Urine 09/13/2021 19.3  Not Estab. ug/mL Final   Microalb/Creat Ratio 09/13/2021 20  0 - 29 mg/g creat Final   Comment:                         Normal:                0 -  29                        Moderately increased: 30 - 300                        Severely increased:       >300     Physical Examination:  BP 118/60   Pulse 61   Ht '5\' 8"'  (1.727 m)   Wt 138 lb (62.6 kg)   SpO2 98%   BMI 20.98 kg/m       ASSESSMENT:  Diabetes type 2 non-insulin-dependent  See history of present illness for detailed discussion of current diabetes management, blood sugar patterns and problems identified  A1c is slightly higher than before at 7.6  He is on Metformin 1 g twice daily, Prandin 0.5 mg AC 3 times daily and Jardiance 10 mg, Actos 30 mg  He has postprandial readings especially with high carbohydrate meals Also because of having dawn phenomenon in the morning he likely needs to have Prandin before breakfast also Discussed that ideally he should be taking mealtime insulin He is eating higher carbohydrate meals but he is somewhat reluctant to do this without trying higher doses of Prandin With his having some difficulty with memory is not always able to remember to take his Prandin  Also discussed potential for using Rybelsus is an option even though it may potentially cause weight loss  His A1c has gone up again and his wife thinks he can do better with his diet Also has not started any exercise He continues to prefer using the freestyle libre sensor to monitor his blood sugar  Currently no side effects from Actos and metformin  PLAN:    Work on reducing carbohydrates especially when eating out Start taking Prandin nightly 0.5 mg at breakfast also preferably 30 minutes before He will need to try and take Prandin 30 minutes before lunch and dinner also Likely needs 2 mg for any  high carbohydrate meals with starchy foods like bread  He will call if his blood sugars are not improved, discussed his readings should be mostly below 180 after eating  Start more consistent walking for exercise Discussed blood sugar targets  fasting and after meals  Urine microalbumin normal  A letter will be sent to help with his authorization for CGM  Patient Instructions  Take Repeglanide 0.52m before bfst and 218mbefore large carb meals   Total visit time for evaluation and management as well as counseling = 30 minutes  AjElayne Snare/15/2023, 1:38 PM   Note: This office note was prepared with Dragon voice recognition system technology. Any transcriptional errors that result from this process are unintentional.

## 2021-09-16 ENCOUNTER — Encounter: Payer: Self-pay | Admitting: Endocrinology

## 2021-09-19 ENCOUNTER — Telehealth: Payer: Self-pay | Admitting: Pharmacy Technician

## 2021-09-19 NOTE — Telephone Encounter (Signed)
-----   Message from Elayne Snare, MD sent at 09/16/2021  9:20 AM EDT ----- Regarding: Needs PA for freestyle libre sensor I have done a letter of requirement for getting the PA for his freestyle libre sensor instead of fingersticks, is not on insulin.  Please see if you can get this authorized through his Medicare B plan, not sure what his secondary insurance is.  See under letter section

## 2021-09-27 ENCOUNTER — Encounter: Payer: Self-pay | Admitting: Endocrinology

## 2021-09-29 ENCOUNTER — Other Ambulatory Visit: Payer: Self-pay | Admitting: Endocrinology

## 2021-09-29 ENCOUNTER — Other Ambulatory Visit (HOSPITAL_COMMUNITY): Payer: Self-pay

## 2021-09-29 MED ORDER — METFORMIN HCL 1000 MG PO TABS
1000.0000 mg | ORAL_TABLET | Freq: Two times a day (BID) | ORAL | 1 refills | Status: DC
  Filled 2021-09-29: qty 180, 90d supply, fill #0

## 2021-09-29 MED ORDER — REPAGLINIDE 0.5 MG PO TABS
ORAL_TABLET | ORAL | 1 refills | Status: DC
  Filled 2021-09-29: qty 90, fill #0

## 2021-09-30 ENCOUNTER — Other Ambulatory Visit (HOSPITAL_COMMUNITY): Payer: Self-pay

## 2021-10-11 NOTE — Telephone Encounter (Signed)
Per parachute patient doesn't qualify for [place] 2. Has to currently be on insulin or have history of problematic hypoglycemia with documentation.

## 2021-11-09 ENCOUNTER — Other Ambulatory Visit (HOSPITAL_COMMUNITY): Payer: Self-pay

## 2021-11-11 ENCOUNTER — Other Ambulatory Visit: Payer: Self-pay | Admitting: Endocrinology

## 2021-11-11 ENCOUNTER — Encounter: Payer: Self-pay | Admitting: Endocrinology

## 2021-11-11 MED ORDER — EMPAGLIFLOZIN 10 MG PO TABS
ORAL_TABLET | Freq: Every day | ORAL | 1 refills | Status: AC
Start: 2021-11-11 — End: 2022-11-11

## 2021-11-12 ENCOUNTER — Other Ambulatory Visit (HOSPITAL_COMMUNITY): Payer: Self-pay

## 2021-11-17 ENCOUNTER — Other Ambulatory Visit (HOSPITAL_COMMUNITY): Payer: Self-pay

## 2021-12-07 ENCOUNTER — Encounter: Payer: Self-pay | Admitting: Endocrinology

## 2021-12-08 ENCOUNTER — Encounter: Payer: Self-pay | Admitting: Endocrinology

## 2021-12-08 ENCOUNTER — Other Ambulatory Visit: Payer: Self-pay

## 2021-12-08 DIAGNOSIS — E1165 Type 2 diabetes mellitus with hyperglycemia: Secondary | ICD-10-CM

## 2021-12-08 MED ORDER — PIOGLITAZONE HCL 45 MG PO TABS: 45.0000 mg | ORAL_TABLET | Freq: Every day | ORAL | 1 refills | Status: DC

## 2021-12-09 NOTE — Telephone Encounter (Signed)
Rx sent in different encounter

## 2021-12-20 ENCOUNTER — Other Ambulatory Visit: Payer: Self-pay

## 2021-12-21 LAB — BASIC METABOLIC PANEL
BUN/Creatinine Ratio: 23 (ref 10–24)
BUN: 19 mg/dL (ref 8–27)
CO2: 22 mmol/L (ref 20–29)
Calcium: 9.2 mg/dL (ref 8.6–10.2)
Chloride: 100 mmol/L (ref 96–106)
Creatinine, Ser: 0.84 mg/dL (ref 0.76–1.27)
Glucose: 128 mg/dL — ABNORMAL HIGH (ref 70–99)
Potassium: 4.3 mmol/L (ref 3.5–5.2)
Sodium: 140 mmol/L (ref 134–144)
eGFR: 96 mL/min/{1.73_m2} (ref >59)

## 2021-12-21 LAB — HEMOGLOBIN A1C
Est. average glucose Bld gHb Est-mCnc: 163 mg/dL
Hgb A1c MFr Bld: 7.3 % — ABNORMAL HIGH (ref 4.8–5.6)

## 2021-12-26 ENCOUNTER — Other Ambulatory Visit: Payer: Medicare Other

## 2021-12-26 ENCOUNTER — Ambulatory Visit (INDEPENDENT_AMBULATORY_CARE_PROVIDER_SITE_OTHER): Payer: Medicare Other | Admitting: Endocrinology

## 2021-12-26 ENCOUNTER — Encounter: Payer: Self-pay | Admitting: Endocrinology

## 2021-12-26 VITALS — BP 140/70 | HR 64 | Ht 68.0 in | Wt 140.0 lb

## 2021-12-26 DIAGNOSIS — E7800 Pure hypercholesterolemia, unspecified: Secondary | ICD-10-CM | POA: Diagnosis not present

## 2021-12-26 DIAGNOSIS — E1165 Type 2 diabetes mellitus with hyperglycemia: Secondary | ICD-10-CM

## 2021-12-26 NOTE — Progress Notes (Unsigned)
Patient ID: Joshua Lamb, male   DOB: [birth year], 66 y.o.   MRN: [id number]           Reason for Appointment: Follow-up for Type 2 Diabetes  Referring PCP: Marton Redwood   History of Present Illness:          Date of diagnosis of type 2 diabetes mellitus:   2017    Background history:  He was diagnosed when he had blood sugars above normal without any symptoms He believes his highest A1c was 7.8 Initially was treated with Metformin and apparently because of inadequate level of control he was given 25 mg Jardiance also probably in 2017 Apparently his weight prior to starting Jardiance was 190 pounds He progressively lost weight with this and his A1c has been between 6.3 and 7.3 since 2020  Recent history:   His A1c is progressively higher at 7.6 compared to 6.8 previously  Non-insulin hypoglycemic drugs: Metformin 1 g twice daily, Jardiance 10 mg daily, Actos 30 mg daily, Prandin 0.5 mg 3 times daily  Current management, blood sugar patterns and problems identified: He has been continuing the freestyle libre preoperatively and currently not getting insurance coverage for this Again he is having excessive postprandial hyperglycemia Likely more often having high sugars either based on the types of foods he is having with more carbohydrate or eating out or forgetting his PRANDIN  Also glucose monitoring may not be complete after dinner and a couple of days are incomplete PIOGLITAZONE was increased to 45 mg but does not appear to have benefit from this, no edema He has been advised to try and take his Prandin up to 30 minutes before meals especially if eating more high carbohydrate or high fat meals He will sometimes eat fast food hamburgers causing his blood sugars to be high He may not have his medication with him when he is going out Usually not taking it at breakfast since he has less carbohydrate in the morning but his blood sugars still go up after eating along with a dawn  phenomenon Highest blood sugars are generally after lunch highest blood sugar can be 350 Weight is down about 1       wts pickle skate Side effects from medications have been: None     Meal times are:  Breakfast is at 7 AM, dinner 6:30 PM  Typical meal intake: Breakfast is   toast with eggs and sausage with 4 ounces of juice.  Dinner usually salad, vegetables and protein.  Snacks apple or peanuts             Glucose monitoring:           Glucometer:  Freestyle libre 2  Interpretation of his download as follows  Blood sugars are very consistently near target in the 130 range early part of the night and then showing dawn phenomenon followed by variable postprandially high readings especially after lunch and dinner with average nearly 190 after both meals Lowest blood sugars are right after midnight Premeal blood sugars are mildly increased at all meals and highest 158 before supper However postprandial excursions are quite variable, possibly more consistently higher after dinner with diet and complete  No hypoglycemia  CGM use % of time   2-week average/GV   Time in range        %  % Time Above 180   % Time above 250   % Time Below 70      PRE-MEAL Fasting Lunch Dinner Bedtime  Overall  Glucose range:       Averages: 124       POST-MEAL PC Breakfast PC Lunch PC Dinner  Glucose range:     Averages:         CGM use % of time 69  2-week average/GV 158, GMI 7.1  Time in range       80 %  % Time Above 180 15+5     % Time Below 70 0     PRE-MEAL 4-6 AM Lunch Dinner Bedtime Overall  Glucose range:       Averages: 143   156 158   POST-MEAL PC Breakfast PC Lunch PC Dinner  Glucose range:     Averages: 171 189 189     Dietician visit, most recent: 2017  Weight history:  Wt Readings from Last 3 Encounters:  12/26/21 140 lb (63.5 kg)  09/15/21 138 lb (62.6 kg)  06/13/21 139 lb 9.6 oz (63.3 kg)    Glycemic control:   Lab Results  Component Value Date   HGBA1C  7.3 (H) 12/20/2021   HGBA1C 7.6 (H) 09/13/2021   HGBA1C 7.1 (H) 06/09/2021   Lab Results  Component Value Date   MICROALBUR 24 08/14/2019   LDLCALC 92 02/23/2021   CREATININE 0.84 12/20/2021   Lab Results  Component Value Date   MICRALBCREAT 20 09/13/2021    Lab Results  Component Value Date   FRUCTOSAMINE 332 (H) 01/06/2021   FRUCTOSAMINE 280 02/25/2020    No visits with results within 1 Week(s) from this visit.  Latest known visit with results is:  Refill on 12/08/2021  Component Date Value Ref Range Status   Glucose 12/20/2021 128 (H)  70 - 99 mg/dL Final   BUN 12/20/2021 19  8 - 27 mg/dL Final   Creatinine, Ser 12/20/2021 0.84  0.76 - 1.27 mg/dL Final   eGFR 12/20/2021 96  >59 mL/min/1.73 Final   BUN/Creatinine Ratio 12/20/2021 23  10 - 24 Final   Sodium 12/20/2021 140  134 - 144 mmol/L Final   Potassium 12/20/2021 4.3  3.5 - 5.2 mmol/L Final   Chloride 12/20/2021 100  96 - 106 mmol/L Final   CO2 12/20/2021 22  20 - 29 mmol/L Final   Calcium 12/20/2021 9.2  8.6 - 10.2 mg/dL Final   Hgb A1c MFr Bld 12/20/2021 7.3 (H)  4.8 - 5.6 % Final   Comment:          Prediabetes: 5.7 - 6.4          Diabetes: >6.4          Glycemic control for adults with diabetes: <7.0    Est. average glucose Bld gHb Est-m* 12/20/2021 163  mg/dL Final    Allergies as of 12/26/2021   No Known Allergies      Medication List        Accurate as of December 26, 2021  1:45 PM. If you have any questions, ask your nurse or doctor.          acetaminophen 500 MG tablet Commonly known as: TYLENOL Take 1,000 mg by mouth every 6 (six) hours as needed for moderate pain.   Carestart COVID-19 Home Test Kit Generic drug: COVID-19 At Home Antigen Test Use as directed   donepezil 10 MG tablet Commonly known as: ARICEPT Take 1 tablet every morning after breakfast   donepezil 23 MG Tabs tablet Commonly known as: ARICEPT Take 1 tablet by mouth daily.   donepezil 23 MG Tabs tablet  Commonly  known as: ARICEPT Take 1 tablet by mouth daily.   empagliflozin 10 MG Tabs tablet Commonly known as: Jardiance TAKE 1 TABLET BY MOUTH ONCE DAILY WITH BREAKFAST   Fluad Quadrivalent 0.5 ML injection Generic drug: influenza vaccine adjuvanted Inject into the muscle.   FreeStyle Libre 2 Sensor Misc Use to check blood sugar daily. (change every 14 days)   FREESTYLE LITE test strip Generic drug: glucose blood 1 each by Other route as needed for other. Use as instructed to check blood sugar once a day dx code E11.65   glucose blood test strip Use to check blood sugar twice daily   metFORMIN 1000 MG tablet Commonly known as: GLUCOPHAGE Take 1 tablet by mouth 2 times daily with a meal.   OneTouch Delica Lancets 78G Misc Use to check blood sugar daily.   OneTouch Verio Reflect w/Device Kit Use to check blood sugar daily.   pantoprazole 40 MG tablet Commonly known as: PROTONIX Take 40 mg by mouth daily as needed (for acid reflux).   Pfizer COVID-19 Vac Bivalent injection Generic drug: COVID-19 mRNA bivalent vaccine Therapist, music) Inject into the muscle.   pioglitazone 45 MG tablet Commonly known as: Actos Take 1 tablet (45 mg total) by mouth daily.   polyethylene glycol-electrolytes 420 g solution Commonly known as: NuLYTELY Use as directed   repaglinide 2 MG tablet Commonly known as: Prandin Take 1 tablet (2 mg total) by mouth 2 (two) times daily before a meal.   repaglinide 0.5 MG tablet Commonly known as: PRANDIN Take 2 tablets by mouth before breakfast and take 2 tablets by mouth before lunch.   rosuvastatin 20 MG tablet Commonly known as: CRESTOR TAKE 1 TABLET BY MOUTH ONCE A DAY   rosuvastatin 20 MG tablet Commonly known as: CRESTOR TAKE 1 TABLET BY MOUTH ONCE A DAY   rosuvastatin 20 MG tablet Commonly known as: CRESTOR TAKE 1 TABLET BY MOUTH ONCE A DAY        Allergies: No Known Allergies  Past Medical History:  Diagnosis Date   Arthritis    Diabetes  mellitus without complication (HCC)    GERD (gastroesophageal reflux disease)    High cholesterol     Past Surgical History:  Procedure Laterality Date   CLEFT LIP REPAIR     CLEFT PALATE REPAIR      Family History  Problem Relation Age of Onset   Diabetes Father    Heart disease Neg Hx     Social History:  reports that he has never smoked. He has never used smokeless tobacco. He reports current alcohol use. He reports that he does not use drugs.   Review of Systems   Lipid history: Lipids controlled with 20 mg Crestor, followed by PCP   Lab Results  Component Value Date   LDLCALC 92 02/23/2021   TRIG 62 08/07/2019           Blood pressure history:  Blood pressure may be low normal at times  BP Readings from Last 3 Encounters:  12/26/21 (!) 140/70  09/15/21 118/60  06/13/21 120/70    Most recent eye exam was in 9/21  Most recent foot exam: 11/21  Currently known complications of diabetes: None  LABS:  No visits with results within 1 Week(s) from this visit.  Latest known visit with results is:  Refill on 12/08/2021  Component Date Value Ref Range Status   Glucose 12/20/2021 128 (H)  70 - 99 mg/dL Final   BUN 12/20/2021 19  8 - 27 mg/dL Final   Creatinine, Ser 12/20/2021 0.84  0.76 - 1.27 mg/dL Final   eGFR 12/20/2021 96  >59 mL/min/1.73 Final   BUN/Creatinine Ratio 12/20/2021 23  10 - 24 Final   Sodium 12/20/2021 140  134 - 144 mmol/L Final   Potassium 12/20/2021 4.3  3.5 - 5.2 mmol/L Final   Chloride 12/20/2021 100  96 - 106 mmol/L Final   CO2 12/20/2021 22  20 - 29 mmol/L Final   Calcium 12/20/2021 9.2  8.6 - 10.2 mg/dL Final   Hgb A1c MFr Bld 12/20/2021 7.3 (H)  4.8 - 5.6 % Final   Comment:          Prediabetes: 5.7 - 6.4          Diabetes: >6.4          Glycemic control for adults with diabetes: <7.0    Est. average glucose Bld gHb Est-m* 12/20/2021 163  mg/dL Final    Physical Examination:  BP (!) 140/70   Pulse 64   Ht '5\' 8"'  (1.727 m)    Wt 140 lb (63.5 kg)   SpO2 94%   BMI 21.29 kg/m       ASSESSMENT:  Diabetes type 2 non-insulin-dependent  See history of present illness for detailed discussion of current diabetes management, blood sugar patterns and problems identified  A1c is slightly higher than before at 7.6  He is on Metformin 1 g twice daily, Prandin 0.5 mg AC 3 times daily and Jardiance 10 mg, Actos 30 mg  He has postprandial readings especially with high carbohydrate meals Also because of having dawn phenomenon in the morning he likely needs to have Prandin before breakfast also Discussed that ideally he should be taking mealtime insulin He is eating higher carbohydrate meals but he is somewhat reluctant to do this without trying higher doses of Prandin With his having some difficulty with memory is not always able to remember to take his Prandin  Also discussed potential for using Rybelsus is an option even though it may potentially cause weight loss  His A1c has gone up again and his wife thinks he can do better with his diet Also has not started any exercise He continues to prefer using the freestyle libre sensor to monitor his blood sugar  Currently no side effects from Actos and metformin  PLAN:    Work on reducing carbohydrates especially when eating out Start taking Prandin nightly 0.5 mg at breakfast also preferably 30 minutes before He will need to try and take Prandin 30 minutes before lunch and dinner also Likely needs 2 mg for any high carbohydrate meals with starchy foods like bread  He will call if his blood sugars are not improved, discussed his readings should be mostly below 180 after eating  Start more consistent walking for exercise Discussed blood sugar targets fasting and after meals  Urine microalbumin normal  A letter will be sent to help with his authorization for CGM  There are no Patient Instructions on file for this visit.   Total visit time for evaluation and  management as well as counseling = 30 minutes  Elayne Snare 12/26/2021, 1:45 PM   Note: This office note was prepared with Dragon voice recognition system technology. Any transcriptional errors that result from this process are unintentional.

## 2021-12-28 ENCOUNTER — Encounter: Payer: Self-pay | Admitting: Endocrinology

## 2022-01-02 ENCOUNTER — Encounter: Payer: Self-pay | Admitting: Endocrinology

## 2022-01-02 ENCOUNTER — Other Ambulatory Visit (HOSPITAL_COMMUNITY): Payer: Self-pay

## 2022-01-02 DIAGNOSIS — E1165 Type 2 diabetes mellitus with hyperglycemia: Secondary | ICD-10-CM

## 2022-01-03 ENCOUNTER — Other Ambulatory Visit (HOSPITAL_COMMUNITY): Payer: Self-pay

## 2022-01-04 MED ORDER — REPAGLINIDE 0.5 MG PO TABS: ORAL_TABLET | ORAL | 1 refills | Status: DC

## 2022-01-20 ENCOUNTER — Encounter: Payer: Self-pay | Admitting: Endocrinology

## 2022-01-20 DIAGNOSIS — E1165 Type 2 diabetes mellitus with hyperglycemia: Secondary | ICD-10-CM

## 2022-01-23 ENCOUNTER — Other Ambulatory Visit: Payer: Self-pay | Admitting: Endocrinology

## 2022-01-23 MED ORDER — NOVOLOG MIX 70/30 FLEXPEN (70-30) 100 UNIT/ML ~~LOC~~ SUPN: 30.0000 [IU] | PEN_INJECTOR | Freq: Every day | SUBCUTANEOUS | 0 refills | Status: DC

## 2022-01-23 MED ORDER — INSULIN PEN NEEDLE 31G X 5 MM MISC: 1 refills | Status: DC

## 2022-02-02 ENCOUNTER — Other Ambulatory Visit: Payer: Self-pay

## 2022-02-02 DIAGNOSIS — E1165 Type 2 diabetes mellitus with hyperglycemia: Secondary | ICD-10-CM

## 2022-02-02 MED ORDER — NOVOLOG FLEXPEN 100 UNIT/ML ~~LOC~~ SOPN: PEN_INJECTOR | SUBCUTANEOUS | 1 refills | Status: AC

## 2022-02-02 MED ORDER — METFORMIN HCL 1000 MG PO TABS: 1000.0000 mg | ORAL_TABLET | Freq: Two times a day (BID) | ORAL | 1 refills | Status: AC

## 2022-02-02 NOTE — Telephone Encounter (Signed)
Fasting blood sugar was 180. Gave 12 units at breakfast and 2 before lunch. Blood sugar currently is at 400 and rising. Patient is connected to [facility].

## 2022-02-10 ENCOUNTER — Encounter: Payer: Self-pay | Admitting: Endocrinology

## 2022-02-10 DIAGNOSIS — E1165 Type 2 diabetes mellitus with hyperglycemia: Secondary | ICD-10-CM

## 2022-03-06 NOTE — Telephone Encounter (Signed)
Wife called states that patient's blood sugar are staying around 250. His dose for prednisone is decreasing but sugars are still staying high. Just using fast acting is causing him to drop really low and they are reluctant of using that. Patient is always high and not feeling well because of sugars. Please advise Patient is connect on [place] system. Ok to reply to patient via mychart as well.

## 2022-03-10 MED ORDER — NOVOLOG MIX 70/30 FLEXPEN (70-30) 100 UNIT/ML ~~LOC~~ SUPN: PEN_INJECTOR | SUBCUTANEOUS | 0 refills | Status: AC

## 2022-03-10 MED ORDER — INSULIN PEN NEEDLE 31G X 5 MM MISC: 1 refills | Status: AC

## 2022-03-16 NOTE — Progress Notes (Signed)
Patient ID: Joshua Lamb, male   DOB: [birth year], 66 y.o.   MRN: [id number]           Reason for Appointment: Follow-up for Type 2 Diabetes  Referring PCP: Marton Redwood   History of Present Illness:          Date of diagnosis of type 2 diabetes mellitus:   2017    Background history:  He was diagnosed when he had blood sugars above normal without any symptoms He believes his highest A1c was 7.8 Initially was treated with Metformin and apparently because of inadequate level of control he was given 25 mg Jardiance also probably in 2017 Apparently his weight prior to starting Jardiance was 190 pounds He progressively lost weight with this and his A1c has been between 6.3 and 7.3 since 2020  Recent history:   His A1c is higher as expected at 8.1  INSULIN regimen: NovoLog mix 10 units before breakfast, 15 at lunch and 5 at dinner  Non-insulin hypoglycemic drugs: Metformin 1 g twice daily, Actos 45 mg daily, Prandin 0.5 mg up to 3 times daily  Current management, blood sugar patterns and problems identified: He has been on insulin since he got prednisone in October Although he has been reportedly only on 2.5 mg daily over the last few weeks he still appears to be requiring insulin and recent average blood sugar is about 180 As expected most of his high blood sugars are in the mornings and afternoons  For simplicity has been on 44/81 insulin as above The dose has been adjusted periodically based on his blood sugars  Although this was not clarified he appears to be not taking Jardiance currently Because of fatigue he has not been very active       Exercise: Previously with skateboarding, pickleball and doing some weights  Side effects from medications have been: None     Meal times are:  Breakfast is at 7 AM, dinner 6:30 PM  Typical meal intake: Breakfast is   toast with eggs and sausage with 4 ounces of juice.  Dinner usually salad, vegetables and protein.  Snacks apple or  peanuts             Glucose monitoring:           Glucometer:  Freestyle libre 2  Interpretation of his download as follows  Blood sugars generally appear to be rising significantly in the early morning until at least late afternoon Has significant variability in the afternoons Overnight blood sugars are inconsistent although relatively better recently and no hypoglycemia POSTPRANDIAL readings usually rise after breakfast and peak around 8 AM and averaging 213 compared to FASTING average 160 However blood sugars usually are continuing to be high until mid afternoon with some variability Blood sugars generally drop significantly between 4 PM-7 PM and occasionally low normal  Generally blood sugars are not rising after dinner He will sometimes have significant rebound after low normal sugars His best day on an average was yesterday with overall average 164  CGM use % of time 92  2-week average/GV 181  Time in range        50%  % Time Above 180 39  % Time above 250 10  % Time Below 70 1     Previously:  CGM use % of time 79  2-week average/GV   Time in range 86     %  % Time Above 180 12 +1  % Time above 250   %  Time Below 70      PRE-MEAL Fasting Lunch Dinner Bedtime Overall  Glucose range:       Averages: 124       POST-MEAL PC Breakfast PC Lunch PC Dinner  Glucose range:     Averages:   165     Dietician visit, most recent: 2017  Weight history:  Wt Readings from Last 3 Encounters:  03/17/22 143 lb (64.9 kg)  12/26/21 140 lb (63.5 kg)  09/15/21 138 lb (62.6 kg)    Glycemic control:   Lab Results  Component Value Date   HGBA1C 8.1 (A) 03/17/2022   HGBA1C 7.3 (H) 12/20/2021   HGBA1C 7.6 (H) 09/13/2021   Lab Results  Component Value Date   MICROALBUR 24 08/14/2019   LDLCALC 92 02/23/2021   CREATININE 0.90 03/17/2022   Lab Results  Component Value Date   MICRALBCREAT 20 09/13/2021    Lab Results  Component Value Date   FRUCTOSAMINE 332 (H)  01/06/2021   FRUCTOSAMINE 280 02/25/2020    Office Visit on 03/17/2022  Component Date Value Ref Range Status   TSH 03/17/2022 1.71  0.35 - 5.50 uIU/mL Final   Sodium 03/17/2022 139  135 - 145 mEq/L Final   Potassium 03/17/2022 4.2  3.5 - 5.1 mEq/L Final   Chloride 03/17/2022 99  96 - 112 mEq/L Final   CO2 03/17/2022 32  19 - 32 mEq/L Final   Glucose, Bld 03/17/2022 191 (H)  70 - 99 mg/dL Final   BUN 03/17/2022 17  6 - 23 mg/dL Final   Creatinine, Ser 03/17/2022 0.90  0.40 - 1.50 mg/dL Final   Total Bilirubin 03/17/2022 0.5  0.2 - 1.2 mg/dL Final   Alkaline Phosphatase 03/17/2022 30 (L)  39 - 117 U/L Final   AST 03/17/2022 60 (H)  0 - 37 U/L Final   ALT 03/17/2022 107 (H)  0 - 53 U/L Final   Total Protein 03/17/2022 7.2  6.0 - 8.3 g/dL Final   Albumin 03/17/2022 4.6  3.5 - 5.2 g/dL Final   GFR 03/17/2022 88.23  >60.00 mL/min Final   Calculated using the CKD-EPI Creatinine Equation (2021)   Calcium 03/17/2022 9.5  8.4 - 10.5 mg/dL Final   Hemoglobin A1C 03/17/2022 8.1 (A)  4.0 - 5.6 % Final    Allergies as of 03/17/2022   No Known Allergies      Medication List        Accurate as of March 17, 2022  2:49 PM. If you have any questions, ask your nurse or doctor.          acetaminophen 500 MG tablet Commonly known as: TYLENOL Take 1,000 mg by mouth every 6 (six) hours as needed for moderate pain.   Carestart COVID-19 Home Test Kit Generic drug: COVID-19 At Home Antigen Test Use as directed   donepezil 10 MG tablet Commonly known as: ARICEPT Take 1 tablet every morning after breakfast   donepezil 23 MG Tabs tablet Commonly known as: ARICEPT Take 1 tablet by mouth daily.   donepezil 23 MG Tabs tablet Commonly known as: ARICEPT Take 1 tablet by mouth daily.   empagliflozin 10 MG Tabs tablet Commonly known as: Jardiance TAKE 1 TABLET BY MOUTH ONCE DAILY WITH BREAKFAST   Fluad Quadrivalent 0.5 ML injection Generic drug: influenza vaccine adjuvanted Inject  into the muscle.   FreeStyle Libre 2 Sensor Misc Use to check blood sugar daily. (change every 14 days)   FREESTYLE LITE test strip Generic drug: glucose  blood 1 each by Other route as needed for other. Use as instructed to check blood sugar once a day dx code E11.65   glucose blood test strip Use to check blood sugar twice daily   Insulin Pen Needle 31G X 5 MM Misc Use to inject insulin up to 5 times a day   metFORMIN 1000 MG tablet Commonly known as: GLUCOPHAGE Take 1 tablet by mouth 2 times daily with a meal.   NovoLOG FlexPen 100 UNIT/ML FlexPen Generic drug: insulin aspart Take 10 units if blood sugars are over 300   NovoLOG Mix 70/30 FlexPen (70-30) 100 UNIT/ML FlexPen Generic drug: insulin aspart protamine - aspart Inject 8 units before breakfast 15 units before lunch and 5 units before dinner.   OneTouch Delica Lancets 81R Misc Use to check blood sugar daily.   OneTouch Verio Reflect w/Device Kit Use to check blood sugar daily.   pantoprazole 40 MG tablet Commonly known as: PROTONIX Take 40 mg by mouth daily as needed (for acid reflux).   Pfizer COVID-19 Vac Bivalent injection Generic drug: COVID-19 mRNA bivalent vaccine Therapist, music) Inject into the muscle.   pioglitazone 45 MG tablet Commonly known as: Actos Take 1 tablet (45 mg total) by mouth daily.   polyethylene glycol-electrolytes 420 g solution Commonly known as: NuLYTELY Use as directed   repaglinide 2 MG tablet Commonly known as: Prandin Take 1 tablet (2 mg total) by mouth 2 (two) times daily before a meal.   repaglinide 0.5 MG tablet Commonly known as: PRANDIN Take 1 tablet before each meal   rosuvastatin 20 MG tablet Commonly known as: CRESTOR TAKE 1 TABLET BY MOUTH ONCE A DAY What changed: Another medication with the same name was removed. Continue taking this medication, and follow the directions you see here. Changed by: Elayne Snare, MD   rosuvastatin 20 MG tablet Commonly known as:  CRESTOR TAKE 1 TABLET BY MOUTH ONCE A DAY What changed: Another medication with the same name was removed. Continue taking this medication, and follow the directions you see here. Changed by: Elayne Snare, MD        Allergies: No Known Allergies  Past Medical History:  Diagnosis Date   Arthritis    Diabetes mellitus without complication (HCC)    GERD (gastroesophageal reflux disease)    High cholesterol     Past Surgical History:  Procedure Laterality Date   CLEFT LIP REPAIR     CLEFT PALATE REPAIR      Family History  Problem Relation Age of Onset   Diabetes Father    Heart disease Neg Hx     Social History:  reports that he has never smoked. He has never used smokeless tobacco. He reports current alcohol use. He reports that he does not use drugs.   Review of Systems   Lipid history: Lipids controlled with 20 mg Crestor, followed by PCP   Lab Results  Component Value Date   LDLCALC 92 02/23/2021   TRIG 62 08/07/2019           Blood pressure history:  Blood pressure checked twice, better standing at the end of the visit  BP Readings from Last 3 Encounters:  03/17/22 122/64  12/26/21 (!) 140/70  09/15/21 118/60    Most recent eye exam was in 9/21  Most recent foot exam: 11/21  Currently known complications of diabetes: None  Complaining of significant fatigue and weakness   He has been in a study for gene therapy for Alzheimer's disease for  which he apparently has positive genetic screening.    LABS:  Office Visit on 03/17/2022  Component Date Value Ref Range Status   TSH 03/17/2022 1.71  0.35 - 5.50 uIU/mL Final   Sodium 03/17/2022 139  135 - 145 mEq/L Final   Potassium 03/17/2022 4.2  3.5 - 5.1 mEq/L Final   Chloride 03/17/2022 99  96 - 112 mEq/L Final   CO2 03/17/2022 32  19 - 32 mEq/L Final   Glucose, Bld 03/17/2022 191 (H)  70 - 99 mg/dL Final   BUN 03/17/2022 17  6 - 23 mg/dL Final   Creatinine, Ser 03/17/2022 0.90  0.40 - 1.50 mg/dL  Final   Total Bilirubin 03/17/2022 0.5  0.2 - 1.2 mg/dL Final   Alkaline Phosphatase 03/17/2022 30 (L)  39 - 117 U/L Final   AST 03/17/2022 60 (H)  0 - 37 U/L Final   ALT 03/17/2022 107 (H)  0 - 53 U/L Final   Total Protein 03/17/2022 7.2  6.0 - 8.3 g/dL Final   Albumin 03/17/2022 4.6  3.5 - 5.2 g/dL Final   GFR 03/17/2022 88.23  >60.00 mL/min Final   Calculated using the CKD-EPI Creatinine Equation (2021)   Calcium 03/17/2022 9.5  8.4 - 10.5 mg/dL Final   Hemoglobin A1C 03/17/2022 8.1 (A)  4.0 - 5.6 % Final    Physical Examination:  BP 122/64 (Patient Position: Standing)   Pulse 70   Ht _0  (1.727 m)   Wt 143 lb (64.9 kg)   SpO2 95%   BMI 21.74 kg/m       ASSESSMENT:  Diabetes type 2 non-insulin-dependent  See history of present illness for detailed discussion of current diabetes management, blood sugar patterns and problems identified  A1c is 8.1  He is on Metformin 1 g twice daily, Prandin 0.5 mg AC 3 times daily, premixed insulin, Actos 45 mg  He has variable postprandial patterns but usually blood sugars are going up because of prednisone and still appears to be requiring insulin with tendency to hypoglycemia mostly at breakfast now when he is more insulin resistant  Fatigue and weakness: Less likely to be adrenal insufficiency since he had only a short course of prednisone that was tapered quickly and has been on the same dose for some time now  PLAN:    He needs to go back to taking Jardiance 10 mg daily Since his blood sugars are trending lower in the afternoon he can reduce the lunch dose to 10 units Continue 5 units at dinnertime unless blood sugars are low normal at bedtime or overnight Will also try to taper off the insulin as blood sugars come down with taking prednisone every other day in the next few weeks Again continue balanced meals and does not need to severely restrict intake of carbohydrate To be followed up by [facility] next month  Will  check chemistry panel and thyroid levels to evaluate symptoms of fatigue   Patient Instructions  Restart jardiance  Take 10 U in am, 10 at lunch and 5 at dinner   Total visit time for evaluation and management as well as counseling = 30 minutes  Elayne Snare 03/17/2022, 2:49 PM   Note: This office note was prepared with Dragon voice recognition system technology. Any transcriptional errors that result from this process are unintentional.  Addendum: All labs normal except for abnormal liver functions, he will discuss with his local physicians Also reduce metformin to half dose for now  Elayne Snare

## 2022-03-17 ENCOUNTER — Encounter: Payer: Self-pay | Admitting: Endocrinology

## 2022-03-17 ENCOUNTER — Ambulatory Visit (INDEPENDENT_AMBULATORY_CARE_PROVIDER_SITE_OTHER): Payer: Medicare Other | Admitting: Endocrinology

## 2022-03-17 VITALS — BP 122/64 | HR 70 | Ht 68.0 in | Wt 143.0 lb

## 2022-03-17 DIAGNOSIS — R5383 Other fatigue: Secondary | ICD-10-CM | POA: Diagnosis not present

## 2022-03-17 DIAGNOSIS — Z794 Long term (current) use of insulin: Secondary | ICD-10-CM | POA: Diagnosis not present

## 2022-03-17 DIAGNOSIS — E1165 Type 2 diabetes mellitus with hyperglycemia: Secondary | ICD-10-CM | POA: Diagnosis not present

## 2022-03-17 LAB — POCT GLYCOSYLATED HEMOGLOBIN (HGB A1C): Hemoglobin A1C: 8.1 % — AB (ref 4.0–5.6)

## 2022-03-17 LAB — TSH: TSH: 1.71 u[IU]/mL (ref 0.35–5.50)

## 2022-03-17 LAB — COMPREHENSIVE METABOLIC PANEL
ALT: 107 U/L — ABNORMAL HIGH (ref 0–53)
AST: 60 U/L — ABNORMAL HIGH (ref 0–37)
Albumin: 4.6 g/dL (ref 3.5–5.2)
Alkaline Phosphatase: 30 U/L — ABNORMAL LOW (ref 39–117)
BUN: 17 mg/dL (ref 6–23)
CO2: 32 mEq/L (ref 19–32)
Calcium: 9.5 mg/dL (ref 8.4–10.5)
Chloride: 99 mEq/L (ref 96–112)
Creatinine, Ser: 0.9 mg/dL (ref 0.40–1.50)
GFR: 88.23 mL/min (ref >60.00)
Glucose, Bld: 191 mg/dL — ABNORMAL HIGH (ref 70–99)
Potassium: 4.2 mEq/L (ref 3.5–5.1)
Sodium: 139 mEq/L (ref 135–145)
Total Bilirubin: 0.5 mg/dL (ref 0.2–1.2)
Total Protein: 7.2 g/dL (ref 6.0–8.3)

## 2022-03-17 NOTE — Patient Instructions (Addendum)
Restart jardiance  Take 10 U in am, 10 at lunch and 5 at dinner

## 2022-03-21 ENCOUNTER — Encounter: Payer: Self-pay | Admitting: Endocrinology

## 2022-03-23 ENCOUNTER — Ambulatory Visit: Payer: Medicare Other | Admitting: Endocrinology

## 2022-04-09 IMAGING — MR MR HEAD WO/W CM
3 series · 48 of 48 positions shown · IV contrast (gadavist)
Comparison: Head CT September 20, 2019.

CLINICAL DATA: Visual spatial impairment.

EXAM:
MRI HEAD WITHOUT AND WITH CONTRAST
TECHNIQUE: Multiplanar, multiecho pulse sequences of the brain and surrounding
structures were obtained without and with intravenous contrast.
Additionally, using NeuroQuant software a 3D volumetric analysis of
the brain was performed and is compared to a normative database
adjusted for age, gender and intracranial volume.
CONTRAST:  6mL GADAVIST GADOBUTROL 1 MMOL/ML IV SOLN

[Series 52: nqsegcb_sc_cor · 1.00mm/px · 17 of 238 slices shown]
[im 1/238]
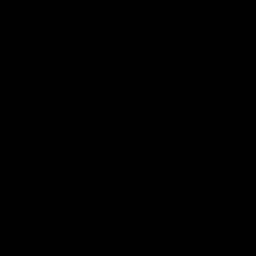
[im 15/238]
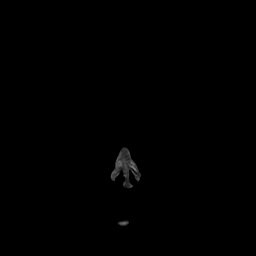
[im 30/238]
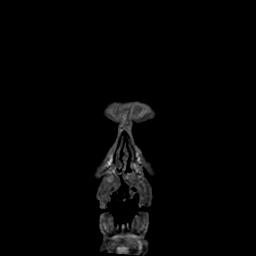
[im 45/238]
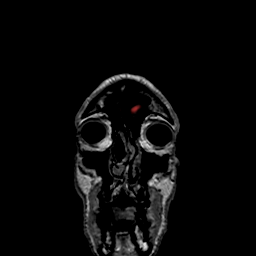
[im 60/238]
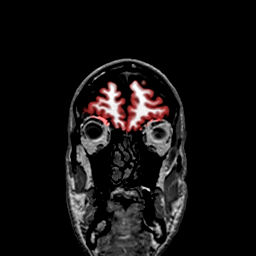
[im 75/238]
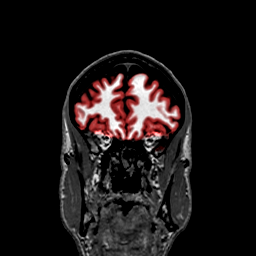
[im 89/238]
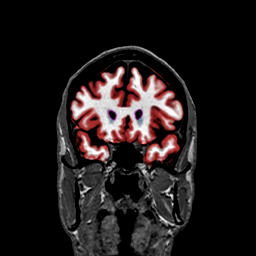
[im 104/238]
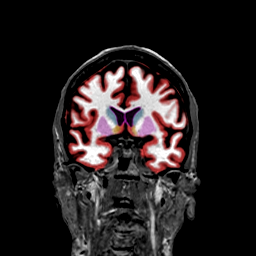
[im 119/238]
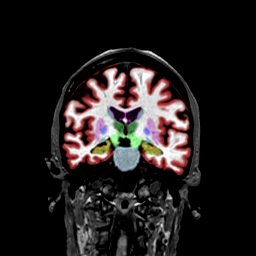
[im 134/238]
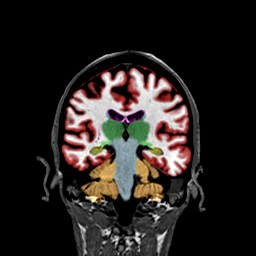
[im 149/238]
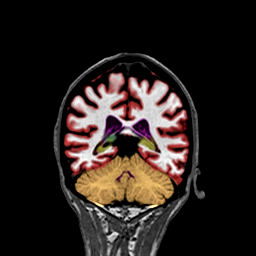
[im 163/238]
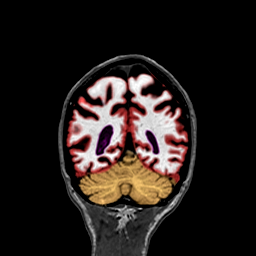
[im 178/238]
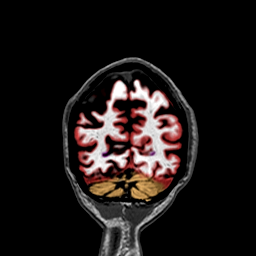
[im 193/238]
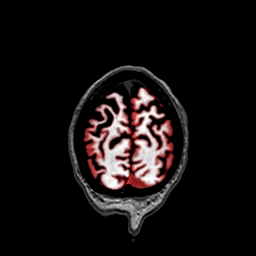
[im 208/238]
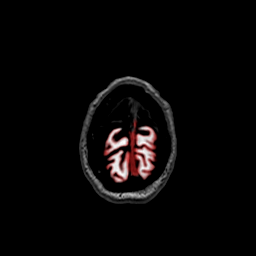
[im 223/238]
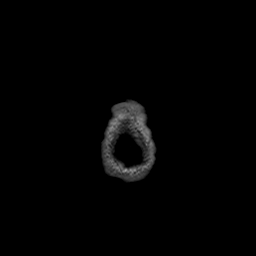
[im 238/238]
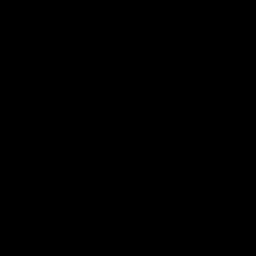

[Series 53: nqsegcb_sc_axl · 1.00mm/px · 16 of 216 slices shown]
[im 1/216]
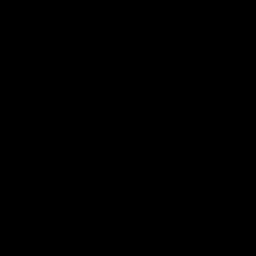
[im 15/216]
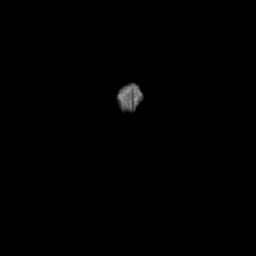
[im 29/216]
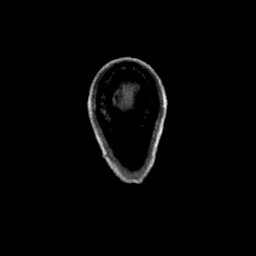
[im 44/216]
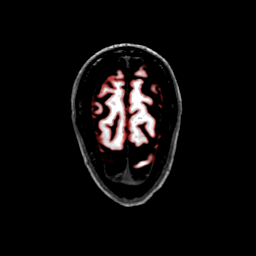
[im 58/216]
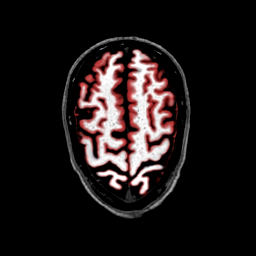
[im 72/216]
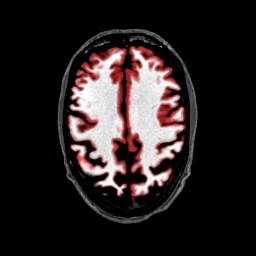
[im 87/216]
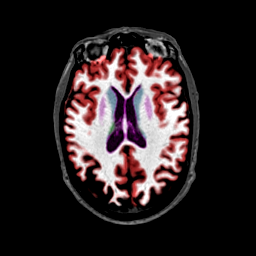
[im 101/216]
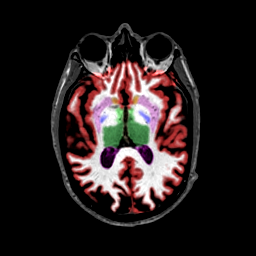
[im 115/216]
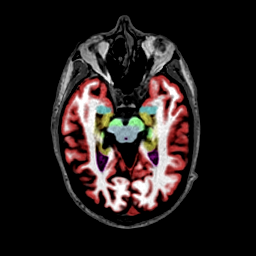
[im 130/216]
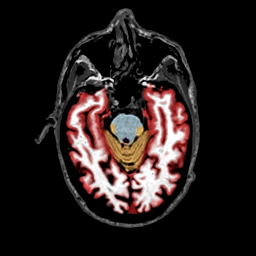
[im 144/216]
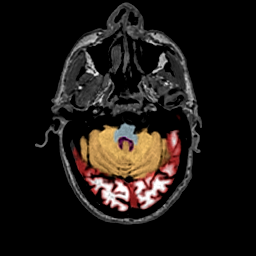
[im 158/216]
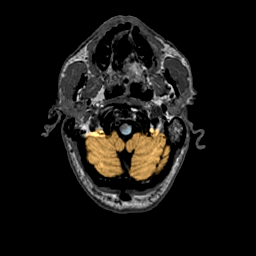
[im 173/216]
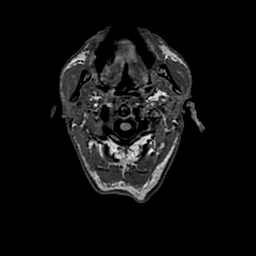
[im 187/216]
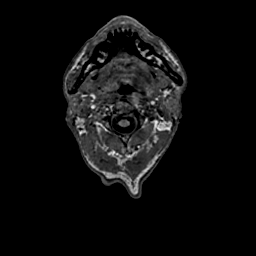
[im 201/216]
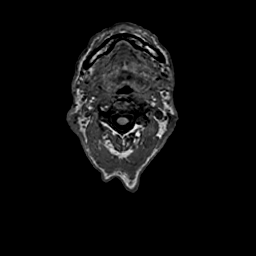
[im 216/216]
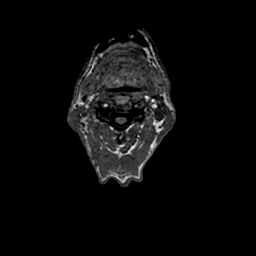

[Series 54: nqsegcb_sc_sag · 1.00mm/px · 15 of 209 slices shown]
[im 1/209]
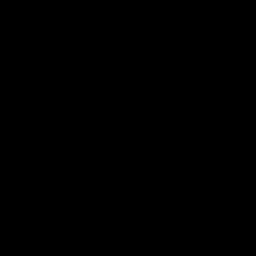
[im 15/209]
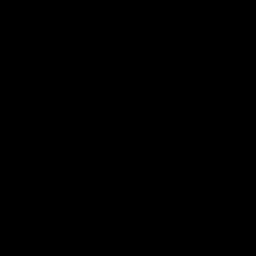
[im 30/209]
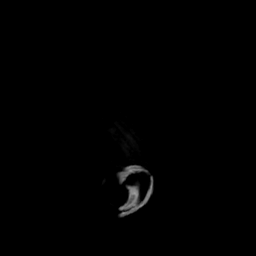
[im 45/209]
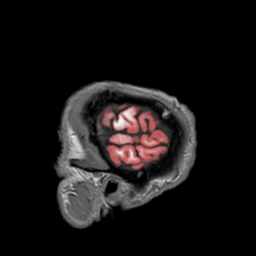
[im 60/209]
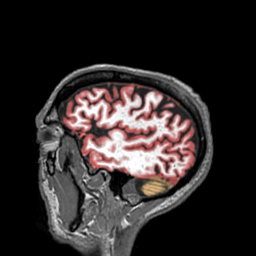
[im 75/209]
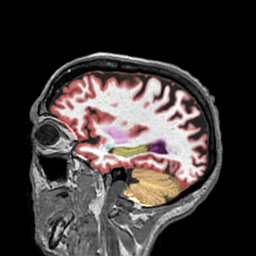
[im 90/209]
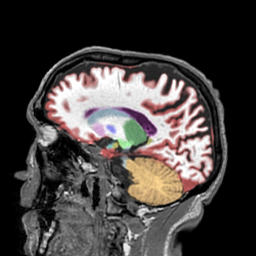
[im 105/209]
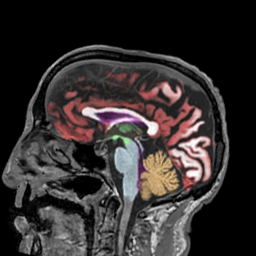
[im 119/209]
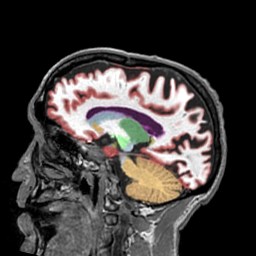
[im 134/209]
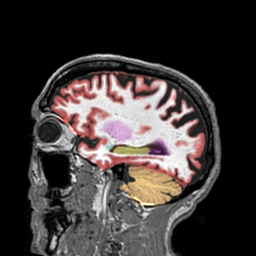
[im 149/209]
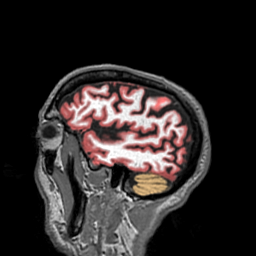
[im 164/209]
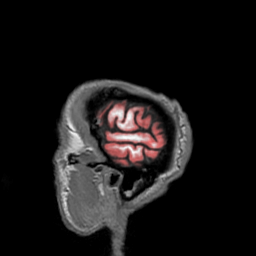
[im 179/209]
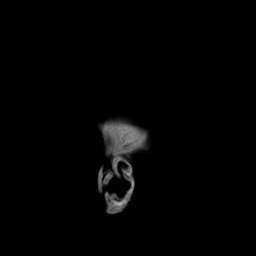
[im 194/209]
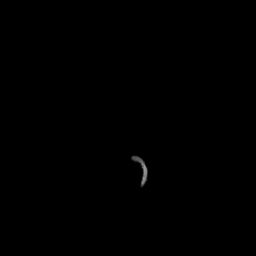
[im 209/209]
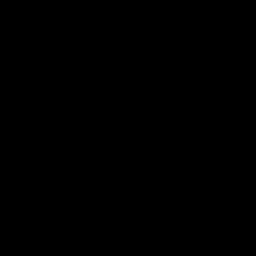

[48 of 48 positions shown; findings below may reference images not displayed]

FINDINGS: Brain: No acute infarction, hemorrhage, hydrocephalus, extra-axial
collection or mass lesion. Few punctate foci of T2 hyperintensity
are seen within the white matter of the cerebral hemispheres,
nonspecific. Marked cortical atrophy in the bilateral
parietooccipital regions, more pronounced on the right side. No
focus of abnormal contrast enhancement are

Vascular: Normal flow voids.

Skull and upper cervical spine: Normal marrow signal.

Sinuses/Orbits: Bilateral lens surgery. Trace mucosal thickening of
the ethmoid cells and frontal sinuses.

Other: A 12 mm left parotid nodule.

NeuroQuant Findings:

Volumetric analysis of the brain was performed, with a fully
detailed report in [HOSPITAL] PACS. Briefly, the comparison with age and
gender matched reference reveals marked decreased volume of the
cortical gray matter.
IMPRESSION: 1. Marked cortical atrophy in the bilateral parietooccipital
regions, more pronounced on the right side.
2. Few punctate foci of T2 hyperintensity within the white matter of
the cerebral hemispheres, nonspecific.
3. NeuroQuant volumetric analysis of the brain, see details on
[HOSPITAL] PACS.

## 2022-04-10 ENCOUNTER — Other Ambulatory Visit: Payer: Self-pay

## 2022-04-10 DIAGNOSIS — E1165 Type 2 diabetes mellitus with hyperglycemia: Secondary | ICD-10-CM

## 2022-04-10 MED ORDER — REPAGLINIDE 0.5 MG PO TABS: ORAL_TABLET | ORAL | 1 refills | Status: AC

## 2022-05-13 ENCOUNTER — Other Ambulatory Visit: Payer: Self-pay

## 2022-05-13 DIAGNOSIS — E1165 Type 2 diabetes mellitus with hyperglycemia: Secondary | ICD-10-CM

## 2022-05-13 MED ORDER — PIOGLITAZONE HCL 45 MG PO TABS: 45.0000 mg | ORAL_TABLET | Freq: Every day | ORAL | 1 refills | Status: DC

## 2022-12-18 ENCOUNTER — Telehealth: Payer: Self-pay

## 2022-12-18 ENCOUNTER — Other Ambulatory Visit: Payer: Self-pay

## 2022-12-18 DIAGNOSIS — E1165 Type 2 diabetes mellitus with hyperglycemia: Secondary | ICD-10-CM

## 2022-12-18 MED ORDER — PIOGLITAZONE HCL 45 MG PO TABS
45.0000 mg | ORAL_TABLET | Freq: Every day | ORAL | 0 refills | Status: AC
Start: 2022-12-18 — End: ?

## 2022-12-18 NOTE — Telephone Encounter (Signed)
LMx1 to office to schedule follow up appointment with Dr. Erroll Luna.

## 2022-12-18 NOTE — Telephone Encounter (Signed)
Contact patient to schedule with new provider.

## 2022-12-19 NOTE — Telephone Encounter (Signed)
LMx1 to call office to schedule an appointment with Dr. Erroll Luna.

## 2023-03-19 ENCOUNTER — Other Ambulatory Visit: Payer: Self-pay
